# Patient Record
Sex: Female | Born: 1975 | Race: White | Hispanic: No | Marital: Married | Smoking: Never smoker
Health system: Southern US, Community
[De-identification: ages and names within clinical notes are randomized; demographics above are authoritative.]

## PROBLEM LIST (undated history)

## (undated) DIAGNOSIS — R519 Headache, unspecified: Secondary | ICD-10-CM

## (undated) DIAGNOSIS — I10 Essential (primary) hypertension: Secondary | ICD-10-CM

## (undated) DIAGNOSIS — R51 Headache: Secondary | ICD-10-CM

## (undated) DIAGNOSIS — R079 Chest pain, unspecified: Secondary | ICD-10-CM

## (undated) HISTORY — DX: Headache, unspecified: R51.9

## (undated) HISTORY — PX: TUBAL LIGATION: SHX77

## (undated) HISTORY — DX: Chest pain, unspecified: R07.9

## (undated) HISTORY — DX: Headache: R51

## (undated) HISTORY — DX: Essential (primary) hypertension: I10

---

## 2012-05-23 ENCOUNTER — Encounter: Payer: Self-pay | Admitting: Cardiology

## 2012-05-23 ENCOUNTER — Ambulatory Visit (INDEPENDENT_AMBULATORY_CARE_PROVIDER_SITE_OTHER): Payer: BC Managed Care – PPO | Admitting: Cardiology

## 2012-05-23 VITALS — BP 160/100 | HR 59 | Ht 62.0 in | Wt 256.8 lb

## 2012-05-23 DIAGNOSIS — R5381 Other malaise: Secondary | ICD-10-CM

## 2012-05-23 DIAGNOSIS — I10 Essential (primary) hypertension: Secondary | ICD-10-CM

## 2012-05-23 DIAGNOSIS — R0989 Other specified symptoms and signs involving the circulatory and respiratory systems: Secondary | ICD-10-CM

## 2012-05-23 DIAGNOSIS — R5383 Other fatigue: Secondary | ICD-10-CM

## 2012-05-23 DIAGNOSIS — R0683 Snoring: Secondary | ICD-10-CM

## 2012-05-23 DIAGNOSIS — R0602 Shortness of breath: Secondary | ICD-10-CM

## 2012-05-23 DIAGNOSIS — R072 Precordial pain: Secondary | ICD-10-CM

## 2012-05-23 MED ORDER — SPIRONOLACTONE 25 MG PO TABS: 25.0000 mg | ORAL_TABLET | Freq: Every day | ORAL | Status: DC

## 2012-05-23 NOTE — Patient Instructions (Addendum)
Please start Spironolactone 25 mg a day Continue all other medications as listed.  Please return in 1 week for lab work. (BNP, BMP, TSH and Cortisol level) You have also been asked to submit a 24 hour urine sample.  Your physician has recommended that you have a sleep study in [place]. This test records several body functions during sleep, including: brain activity, eye movement, oxygen and carbon dioxide blood levels, heart rate and rhythm, breathing rate and rhythm, the flow of air through your mouth and nose, snoring, body muscle movements, and chest and belly movement.  Follow up with Dr Antoine Poche in 1 month.

## 2012-05-23 NOTE — Progress Notes (Signed)
.   HPI The patient presents for evaluation of hypertension. This has been very difficult to control. She notices back in November when she had a headache. She had her blood pressure checked and she reports it was 225/130. She has had followed him he has been difficult to control since that time. Recently because of some chest discomfort she was sent for a stress perfusion study. However, in stage I of exercise she went up to 230/133. She brings me a blood pressure diary and her pressures are typically 170s systolic and above 95 diastolic despite medical management. She does get some back discomfort some mild chest discomfort but she does not describe classic substernal chest pressure, neck or arm discomfort. She is noticing shortness of breath with activities such as walking through the parking lot which is new over the past several months. She denies any resting shortness of breath, PND or orthopnea. She's not describing palpitations, presyncope or syncope. Of note she does snore, has frequent headaches and daytime somnolence.  Allergies  Allergen Reactions  . Amlodipine     Dizziness, hot flashes and swelling    Current Outpatient Prescriptions  Medication Sig Dispense Refill  . aspirin 325 MG EC tablet Take 325 mg by mouth 2 (two) times daily.       Marland Kitchen lisinopril-hydrochlorothiazide (PRINZIDE,ZESTORETIC) 20-12.5 MG per tablet Take 2 tablets by mouth daily.       Marland Kitchen METOPROLOL TARTRATE PO Take 100 mg by mouth 2 (two) times daily. Take two tabs bid      . potassium chloride (KLOR-CON) 20 MEQ packet Take 20 mEq by mouth daily.      Marland Kitchen spironolactone (ALDACTONE) 25 MG tablet Take 1 tablet (25 mg total) by mouth daily.  30 tablet  11   No current facility-administered medications for this visit.    Past Medical History  Diagnosis Date  . Chest pain   . New onset of headaches   . Hypertension     Past Surgical History  Procedure Laterality Date  . Tubal ligation      Family History    Problem Relation Age of Onset  . Arrhythmia Father   . CAD Paternal Grandfather 65  . Hypertension Father   . Hypertension Mother   . Cancer Mother     Uterine    History   Social History  . Marital Status: Married    Spouse Name: N/A    Number of Children: 4  . Years of Education: N/A   Occupational History  .     Social History Main Topics  . Smoking status: Never Smoker   . Smokeless tobacco: Not on file  . Alcohol Use: Not on file  . Drug Use: Not on file  . Sexually Active: Not on file   Other Topics Concern  . Not on file   Social History Narrative  . No narrative on file    ROS:  Positive for headaches, dyspnea, nausea. Otherwise as stated in the HPI and negative for all other systems.  PHYSICAL EXAM BP 160/100  Pulse 59  Ht 5\' 2"  (1.575 m)  Wt 256 lb 12.8 oz (116.484 kg)  BMI 46.96 kg/m2 GENERAL:  Well appearing HEENT:  Pupils equal round and reactive, fundi not visualized, oral mucosa unremarkable NECK:  No jugular venous distention, waveform within normal limits, carotid upstroke brisk and symmetric, no bruits, no thyromegaly LYMPHATICS:  No cervical, inguinal adenopathy LUNGS:  Clear to auscultation bilaterally BACK:  No CVA tenderness CHEST:  Unremarkable HEART:  PMI not displaced or sustained,S1 and S2 within normal limits, no S3, no S4, no clicks, no rubs, no murmurs ABD:  Flat, positive bowel sounds normal in frequency in pitch, no bruits, no rebound, no guarding, no midline pulsatile mass, no hepatomegaly, no splenomegaly, obese EXT:  2 plus pulses throughout, no edema, no cyanosis no clubbing SKIN:  No rashes no nodules NEURO:  Cranial nerves II through XII grossly intact, motor grossly intact throughout PSYCH:  Cognitively intact, oriented to person place and time  EKG:  Sinus rhythm, rate 59, axis within normal limits, intervals within normal limits, no acute ST-T wave changes.  05/23/2012   ASSESSMENT AND PLAN   HTN - I will look for  secondary causes and check a 24-hour urine for VMA and metanephrines and cortisol level. I will also check a TSH. I will add to her medications by adding spironolactone 25 mg daily. As her potassium has been low she should remain on the current potassium supplementation and I will check a basic metabolic profile in 10 days. I will also screen for sleep apnea.  Sleep apnea - She'll most definitely has this and she will be screened and treated appropriately.  Obesity - The patient understands the need to lose weight with diet and exercise. We have discussed specific strategies for this.  Chest pain - Her pain is somewhat atypical. No change in therapy is indicated. I will consider screening treadmill testing once her blood pressure is controlled.

## 2012-05-27 ENCOUNTER — Other Ambulatory Visit: Payer: Self-pay

## 2012-05-29 ENCOUNTER — Ambulatory Visit: Payer: Self-pay | Admitting: Cardiology

## 2012-05-30 ENCOUNTER — Other Ambulatory Visit (INDEPENDENT_AMBULATORY_CARE_PROVIDER_SITE_OTHER): Payer: BC Managed Care – PPO

## 2012-05-30 DIAGNOSIS — R5383 Other fatigue: Secondary | ICD-10-CM

## 2012-05-30 DIAGNOSIS — I10 Essential (primary) hypertension: Secondary | ICD-10-CM

## 2012-05-30 DIAGNOSIS — R0602 Shortness of breath: Secondary | ICD-10-CM

## 2012-05-30 DIAGNOSIS — R5381 Other malaise: Secondary | ICD-10-CM

## 2012-05-30 LAB — BASIC METABOLIC PANEL WITH GFR
BUN: 13 mg/dL (ref 6–23)
CO2: 30 meq/L (ref 19–32)
Calcium: 9.3 mg/dL (ref 8.4–10.5)
Chloride: 102 meq/L (ref 96–112)
Creatinine, Ser: 0.8 mg/dL (ref 0.4–1.2)
GFR: 88.48 mL/min (ref >60.00)
Glucose, Bld: 83 mg/dL (ref 70–99)
Potassium: 3.2 meq/L — ABNORMAL LOW (ref 3.5–5.1)
Sodium: 137 meq/L (ref 135–145)

## 2012-05-30 LAB — BRAIN NATRIURETIC PEPTIDE: Pro B Natriuretic peptide (BNP): 136 pg/mL — ABNORMAL HIGH (ref 0.0–100.0)

## 2012-05-30 LAB — TSH: TSH: 1.62 u[IU]/mL (ref 0.35–5.50)

## 2012-05-30 LAB — CORTISOL: Cortisol, Plasma: 8 ug/dL

## 2012-06-01 ENCOUNTER — Other Ambulatory Visit: Payer: Self-pay | Admitting: *Deleted

## 2012-06-01 ENCOUNTER — Encounter: Payer: Self-pay | Admitting: Cardiology

## 2012-06-01 DIAGNOSIS — E876 Hypokalemia: Secondary | ICD-10-CM

## 2012-06-05 ENCOUNTER — Encounter: Payer: Self-pay | Admitting: Cardiology

## 2012-06-05 ENCOUNTER — Telehealth: Payer: Self-pay | Admitting: Cardiology

## 2012-06-05 NOTE — Telephone Encounter (Signed)
Pt is having trouble with my chart and she was trying to look up her test results and she has a question regarding her results

## 2012-06-05 NOTE — Telephone Encounter (Signed)
Left message of results on voicemail and asked pt to call back with further questions

## 2012-06-07 ENCOUNTER — Encounter: Payer: Self-pay | Admitting: Cardiology

## 2012-06-07 ENCOUNTER — Other Ambulatory Visit: Payer: Self-pay | Admitting: *Deleted

## 2012-06-07 ENCOUNTER — Telehealth: Payer: Self-pay | Admitting: Internal Medicine

## 2012-06-07 MED ORDER — POTASSIUM CHLORIDE 20 MEQ PO PACK: 20.0000 meq | PACK | Freq: Every day | ORAL | Status: DC

## 2012-06-07 MED ORDER — POTASSIUM CHLORIDE CRYS ER 20 MEQ PO TBCR: 20.0000 meq | EXTENDED_RELEASE_TABLET | Freq: Every day | ORAL | Status: DC

## 2012-06-07 NOTE — Telephone Encounter (Signed)
Called in refill for Klor-Con. Initially ordered packets but verified with patient that she usually takes tablets. Called into pharmacy to ensure patient would receive in tablet form.

## 2012-06-07 NOTE — Telephone Encounter (Signed)
New Problem     Needs clarification of on KLOR-CON.

## 2012-06-07 NOTE — Telephone Encounter (Signed)
PC to Dominique at Pharmacy just re-verifying that the refill of Klor-Con should be in tablet form. Reviewed and agreed.

## 2012-06-13 ENCOUNTER — Other Ambulatory Visit (INDEPENDENT_AMBULATORY_CARE_PROVIDER_SITE_OTHER): Payer: BC Managed Care – PPO

## 2012-06-13 ENCOUNTER — Other Ambulatory Visit: Payer: BC Managed Care – PPO

## 2012-06-13 DIAGNOSIS — E876 Hypokalemia: Secondary | ICD-10-CM

## 2012-06-13 LAB — BASIC METABOLIC PANEL
Calcium: 9.3 mg/dL (ref 8.4–10.5)
GFR: 98.6 mL/min (ref >60.00)
Glucose, Bld: 82 mg/dL (ref 70–99)
Potassium: 3.9 mEq/L (ref 3.5–5.1)
Sodium: 141 mEq/L (ref 135–145)

## 2012-06-14 ENCOUNTER — Other Ambulatory Visit: Payer: BC Managed Care – PPO

## 2012-06-19 ENCOUNTER — Telehealth: Payer: Self-pay | Admitting: Cardiology

## 2012-06-19 NOTE — Telephone Encounter (Signed)
Pt informed of normal lab results

## 2012-06-19 NOTE — Telephone Encounter (Signed)
Message copied by Burnice Logan on Mon Jun 19, 2012 10:33 AM ------      Message from: Rollene Rotunda      Created: Sat Jun 17, 2012  1:38 PM       [facility] [place].  Call Ms. Silbernagel with the results and send results to Estanislado Pandy, MD       ------

## 2012-06-20 ENCOUNTER — Ambulatory Visit (INDEPENDENT_AMBULATORY_CARE_PROVIDER_SITE_OTHER): Payer: BC Managed Care – PPO | Admitting: Cardiology

## 2012-06-20 ENCOUNTER — Encounter: Payer: Self-pay | Admitting: Cardiology

## 2012-06-20 VITALS — BP 126/74 | HR 64 | Ht 61.0 in | Wt 256.8 lb

## 2012-06-20 DIAGNOSIS — I10 Essential (primary) hypertension: Secondary | ICD-10-CM

## 2012-06-20 DIAGNOSIS — R072 Precordial pain: Secondary | ICD-10-CM

## 2012-06-20 NOTE — Patient Instructions (Addendum)
The current medical regimen is effective;  continue present plan and medications.  Follow up in 4 months with Dr Hochrein.  You will receive a letter in the mail 2 months before you are due.  Please call us when you receive this letter to schedule your follow up appointment.  

## 2012-06-20 NOTE — Progress Notes (Signed)
   HPI The patient presents for evaluation of hypertension. This has been very difficult to control. At the last visit I checked VMA and it was normal. I sent her for sleep study which demonstrated moderate apnea she has followup of his CPAP scheduled. I started her on Spiriva one time. Her blood pressures at home are now down to the 140s over 80s or 90s. She's still having some headaches. Overall she feels well. She's not noticing any palpitations, presyncope or syncope. She has no new shortness of breath or chest discomfort. She had followup labs and her potassium was found to be within normal range.  Allergies  Allergen Reactions  . Amlodipine     Dizziness, hot flashes and swelling    Current Outpatient Prescriptions  Medication Sig Dispense Refill  . aspirin 325 MG EC tablet Take 325 mg by mouth 2 (two) times daily.       Marland Kitchen lisinopril-hydrochlorothiazide (PRINZIDE,ZESTORETIC) 20-12.5 MG per tablet Take 2 tablets by mouth daily.       Marland Kitchen METOPROLOL TARTRATE PO Take 100 mg by mouth 2 (two) times daily. Take two tabs bid      . potassium chloride SA (K-DUR,KLOR-CON) 20 MEQ tablet Take 1 tablet (20 mEq total) by mouth daily.  30 tablet  6  . spironolactone (ALDACTONE) 25 MG tablet Take 1 tablet (25 mg total) by mouth daily.  30 tablet  11   No current facility-administered medications for this visit.    Past Medical History  Diagnosis Date  . Chest pain   . New onset of headaches   . Hypertension     Past Surgical History  Procedure Laterality Date  . Tubal ligation      ROS:  Positive for headaches.  Otherwise as stated in the HPI and negative for all other systems.  PHYSICAL EXAM BP 126/74  Pulse 64  Ht 5\' 1"  (1.549 m)  Wt 256 lb 12.8 oz (116.484 kg)  BMI 48.55 kg/m2 GENERAL:  Well appearing NECK:  No jugular venous distention, waveform within normal limits, carotid upstroke brisk and symmetric, no bruits, no thyromegaly LUNGS:  Clear to auscultation bilaterally CHEST:   Unremarkable HEART:  PMI not displaced or sustained,S1 and S2 within normal limits, no S3, no S4, no clicks, no rubs, no murmurs ABD:  Flat, positive bowel sounds normal in frequency in pitch, no bruits, no rebound, no guarding, no midline pulsatile mass, no hepatomegaly, no splenomegaly, obese EXT:  2 plus pulses throughout, no edema, no cyanosis no clubbing  ASSESSMENT AND PLAN  HTN - Her blood pressure now seems to be well controlled.she will continue the meds as listed. Given the combination of ACE inhibitor, potassium and spironolactone I will suggest that she get a BP med per Dr. Neita Carp about 3 times per year. I've given her written instructions to get this checked next month.  Sleep apnea - She will continue with followup and treatment of this..  Obesity - The patient understands the need to lose weight with diet and exercise. We have discussed specific strategies for this.  Chest pain - She's no longer reporting chest discomfort. However, I will consider exercise treadmill testing in the future should she have this complaint.

## 2012-06-28 ENCOUNTER — Ambulatory Visit: Payer: Self-pay | Admitting: Cardiology

## 2012-08-28 ENCOUNTER — Other Ambulatory Visit: Payer: Self-pay | Admitting: *Deleted

## 2012-08-28 DIAGNOSIS — R0683 Snoring: Secondary | ICD-10-CM

## 2012-08-28 DIAGNOSIS — R5383 Other fatigue: Secondary | ICD-10-CM

## 2012-09-11 ENCOUNTER — Encounter: Payer: Self-pay | Admitting: Nurse Practitioner

## 2012-09-11 ENCOUNTER — Other Ambulatory Visit: Payer: Self-pay | Admitting: *Deleted

## 2012-09-11 ENCOUNTER — Ambulatory Visit (INDEPENDENT_AMBULATORY_CARE_PROVIDER_SITE_OTHER): Payer: BC Managed Care – PPO | Admitting: Nurse Practitioner

## 2012-09-11 VITALS — BP 150/100 | HR 80 | Ht 61.0 in | Wt 262.0 lb

## 2012-09-11 DIAGNOSIS — I10 Essential (primary) hypertension: Secondary | ICD-10-CM

## 2012-09-11 LAB — BASIC METABOLIC PANEL
BUN: 13 mg/dL (ref 6–23)
CO2: 26 mEq/L (ref 19–32)
Calcium: 9.1 mg/dL (ref 8.4–10.5)
Chloride: 103 mEq/L (ref 96–112)
Creatinine, Ser: 0.8 mg/dL (ref 0.4–1.2)
GFR: 89.67 mL/min (ref >60.00)
Glucose, Bld: 92 mg/dL (ref 70–99)
Potassium: 3.5 mEq/L (ref 3.5–5.1)
Sodium: 136 mEq/L (ref 135–145)

## 2012-09-11 NOTE — Patient Instructions (Addendum)
Continue to monitor your blood pressure at home  We are going to do a 24 hour ambulatory BP monitor  We are going to arrange for a CTangio of the abdomen/pelvis to look at your renal arteries  Dr. Antoine Poche will see you back for discussion  Here are my tips to lose weight:  1. Drink only water. You do not need milk, juice, tea, soda or diet soda.  2. Do not eat anything "white". This includes white bread, potatoes, rice or mayo  3. Stay away from fried foods and sweets  4. Your portion should be the size of the palm of your hand.  5. Know what your weaknesses are and avoid.  6. Find an exercise you like and do it every day for 45 to 60 minutes.   Call the [facility] office at [phone] if you have any questions, problems or concerns.

## 2012-09-11 NOTE — Progress Notes (Addendum)
Ruth Ruiz Date of Birth: [birth year] Medical Record #[id number]  History of Present Illness: Ruth Ruiz is seen back today for a work in visit. Seen for Dr. Antoine Poche. Has a history of HTN - reportedly difficult to control. Negative VMA in the past. Has morbid obesity. Last seen here in March.   Comes in today. She is here alone. Says her blood pressure continues to spike - especially with activities. BP can be as low as 117 to as high as 230. More swelling noted. Says she restricts her salt but has been using canned goods. Does not eat out. Remains short of breath with exertion. Weight is up 6 pounds.   Current Outpatient Prescriptions on File Prior to Visit  Medication Sig Dispense Refill  . aspirin 325 MG EC tablet Take 325 mg by mouth 2 (two) times daily.       Marland Kitchen lisinopril-hydrochlorothiazide (PRINZIDE,ZESTORETIC) 20-12.5 MG per tablet Take 2 tablets by mouth daily.       Marland Kitchen METOPROLOL TARTRATE PO Take 100 mg by mouth 2 (two) times daily. Take two tabs bid      . potassium chloride SA (K-DUR,KLOR-CON) 20 MEQ tablet Take 1 tablet (20 mEq total) by mouth daily.  30 tablet  6  . spironolactone (ALDACTONE) 25 MG tablet Take 1 tablet (25 mg total) by mouth daily.  30 tablet  11   No current facility-administered medications on file prior to visit.    Allergies  Allergen Reactions  . Amlodipine     Dizziness, hot flashes and swelling    Past Medical History  Diagnosis Date  . Chest pain   . New onset of headaches   . Hypertension     Past Surgical History  Procedure Laterality Date  . Tubal ligation      History  Smoking status  . Never Smoker   Smokeless tobacco  . Not on file    History  Alcohol Use No    Family History  Problem Relation Age of Onset  . Arrhythmia Father   . Hypertension Father   . CAD Paternal Grandfather 39  . Hypertension Mother   . Cancer Mother     Uterine    Review of Systems: The review of systems is per the HPI.  All other  systems were reviewed and are negative.  Physical Exam: BP 150/100  Pulse 80  Ht 5\' 1"  (1.549 m)  Wt 262 lb (118.842 kg)  BMI 49.53 kg/m2 Patient is very pleasant and in no acute distress. She is morbidly obese. Skin is warm and dry. Color is normal.  HEENT is unremarkable. Normocephalic/atraumatic. PERRL. Sclera are nonicteric. Neck is supple. No masses. No JVD. Lungs are clear. Cardiac exam shows a regular rate and rhythm. Abdomen is soft. Extremities are quite full but without significant edema in my opinion. Gait and ROM are intact. No gross neurologic deficits noted.  LABORATORY DATA: Pending  Lab Results  Component Value Date   GLUCOSE 82 06/13/2012   NA 141 06/13/2012   K 3.9 06/13/2012   CL 106 06/13/2012   CREATININE 0.7 06/13/2012   BUN 14 06/13/2012   CO2 28 06/13/2012   TSH 1.62 05/30/2012    Assessment / Plan: 1. Labile HTN - discussed with Dr. Antoine Poche. Will arrange for 24 hour ambulatory BP and check CTA of the abdomen/pelvis to look for renal artery problems. Check BMET  No change in her medicines at this time. Dr. Antoine Poche would like to see her back in follow  up.  2. Morbid obesity - we have discussed the need for weight loss. My recommendations given in her instructions.   Patient is agreeable to this plan and will call if any problems develop in the interim.   Rosalio Macadamia, RN, ANP-C [facility] [address] [place], [place]  [address]  Addendum: Her imaging that was originally ordered has led to MR of the abdomen which shows a benign adenoma. Her aldosterone levels that were subsequently ordered are normal. Unfortunately, the MR of the abdomen did not image this abnormality in the chest. I have talked with Dr. Antoine Poche - the 24 hour ambulatory monitor shows her BP readings are all too high. He would like to have her Aldactone increased to 50 mg a day, see Dr. Neita Carp regarding her adenoma, and I have shown her CT to Dr. Shirlee Latch who agrees with cardiac  MRI. She will need a follow up BMET with the increase in Aldactone.

## 2012-09-14 ENCOUNTER — Ambulatory Visit (INDEPENDENT_AMBULATORY_CARE_PROVIDER_SITE_OTHER)
Admission: RE | Admit: 2012-09-14 | Discharge: 2012-09-14 | Disposition: A | Discharge status: Home / Self Care | Payer: BC Managed Care – PPO | Source: Ambulatory Visit | Attending: Nurse Practitioner | Admitting: Nurse Practitioner

## 2012-09-14 ENCOUNTER — Encounter (INDEPENDENT_AMBULATORY_CARE_PROVIDER_SITE_OTHER): Payer: BC Managed Care – PPO

## 2012-09-14 ENCOUNTER — Encounter: Payer: Self-pay | Admitting: Nurse Practitioner

## 2012-09-14 DIAGNOSIS — I10 Essential (primary) hypertension: Secondary | ICD-10-CM

## 2012-09-14 MED ORDER — IOHEXOL 350 MG/ML SOLN
100.0000 mL | Freq: Once | INTRAVENOUS | Status: AC | PRN
  Administered 2012-09-14: 100 mL via INTRAVENOUS

## 2012-09-15 ENCOUNTER — Other Ambulatory Visit: Payer: Self-pay | Admitting: Nurse Practitioner

## 2012-09-15 ENCOUNTER — Other Ambulatory Visit: Payer: BC Managed Care – PPO | Admitting: *Deleted

## 2012-09-15 DIAGNOSIS — R918 Other nonspecific abnormal finding of lung field: Secondary | ICD-10-CM

## 2012-09-15 DIAGNOSIS — E278 Other specified disorders of adrenal gland: Secondary | ICD-10-CM

## 2012-09-15 DIAGNOSIS — I10 Essential (primary) hypertension: Secondary | ICD-10-CM

## 2012-09-18 LAB — ALDOSTERONE + RENIN ACTIVITY W/ RATIO

## 2012-09-22 ENCOUNTER — Ambulatory Visit
Admission: RE | Admit: 2012-09-22 | Discharge: 2012-09-22 | Disposition: A | Discharge status: Home / Self Care | Payer: BC Managed Care – PPO | Source: Ambulatory Visit | Attending: Nurse Practitioner | Admitting: Nurse Practitioner

## 2012-09-22 ENCOUNTER — Ambulatory Visit: Payer: BC Managed Care – PPO | Admitting: *Deleted

## 2012-09-22 ENCOUNTER — Telehealth: Payer: Self-pay | Admitting: *Deleted

## 2012-09-22 ENCOUNTER — Encounter: Payer: Self-pay | Admitting: Nurse Practitioner

## 2012-09-22 DIAGNOSIS — I10 Essential (primary) hypertension: Secondary | ICD-10-CM

## 2012-09-22 DIAGNOSIS — R918 Other nonspecific abnormal finding of lung field: Secondary | ICD-10-CM

## 2012-09-22 DIAGNOSIS — E278 Other specified disorders of adrenal gland: Secondary | ICD-10-CM

## 2012-09-22 MED ORDER — GADOBENATE DIMEGLUMINE 529 MG/ML IV SOLN
20.0000 mL | Freq: Once | INTRAVENOUS | Status: AC | PRN
  Administered 2012-09-22: 20 mL via INTRAVENOUS

## 2012-09-22 NOTE — Telephone Encounter (Signed)
S/w pt the results are not in for  lab I cancelled lab appt for today and will discuss with pt Monday when Lawson Fiscal gets back. I stated to keep other test today and will f/u with labv to see where results are

## 2012-09-23 LAB — ALDOSTERONE + RENIN ACTIVITY W/ RATIO
ALDO / PRA Ratio: 27.6 Ratio (ref 0.9–28.9)
Aldosterone: 8 ng/dL
PRA LC/MS/MS: 0.29 ng/mL/h (ref 0.25–5.82)

## 2012-09-25 ENCOUNTER — Telehealth: Payer: Self-pay | Admitting: Cardiology

## 2012-09-25 NOTE — Telephone Encounter (Signed)
Pt aware Dr Antoine Poche not in the office today and to my knowledge her results have not been reviewed.  Will forward Norma Fredrickson, NP

## 2012-09-25 NOTE — Telephone Encounter (Signed)
Avie Arenas called and stated pt had called Pam and wanted to know the results. S/w pt I stated pt will be hearing back with the results tomorrow with the MRA and lab results when Dr. Antoine Poche is in the office, Lawson Fiscal has to s/w Dr. Antoine Poche first. Pt verbally understood

## 2012-09-25 NOTE — Telephone Encounter (Signed)
New Problem  Pt is calling about the results of her MRI. She asked if you could give her a call back.

## 2012-09-26 ENCOUNTER — Telehealth: Payer: Self-pay | Admitting: Nurse Practitioner

## 2012-09-26 MED ORDER — SPIRONOLACTONE 50 MG PO TABS: 50.0000 mg | ORAL_TABLET | Freq: Every day | ORAL | Status: DC

## 2012-09-26 NOTE — Telephone Encounter (Signed)
Patient has been called her MR of the abdomen. This shows a benign adenoma. The mass abutting the right atrium was not imaged. I have talked with Dr. Antoine Poche. He has advised increasing the aldactone to 50 mg a day. Check BMET in one week. Her 24 hour BP monitor showed all high readings. Dr. Shirlee Latch has looked at her original CT and feels a cardiac MRI will define the mass in the hilar region abutting the right atrium. Patient has been called. Also told to follow up with Dr. Neita Carp as well. I have sent him my notes and the imaging studies.

## 2012-09-26 NOTE — Telephone Encounter (Signed)
Please see next phone note by Norma Fredrickson, NP

## 2012-09-26 NOTE — Telephone Encounter (Signed)
Follow-up:    Called in wanting to know if Dr. Antoine Poche had red the results of her test today.  Please call back.

## 2012-09-26 NOTE — Addendum Note (Signed)
Addended by: Rosalio Macadamia on: 09/26/2012 04:33 PM   Modules accepted: Orders, Medications

## 2012-09-27 ENCOUNTER — Other Ambulatory Visit: Payer: Self-pay | Admitting: Nurse Practitioner

## 2012-09-27 ENCOUNTER — Telehealth: Payer: Self-pay | Admitting: Nurse Practitioner

## 2012-09-27 DIAGNOSIS — I5189 Other ill-defined heart diseases: Secondary | ICD-10-CM

## 2012-09-27 NOTE — Telephone Encounter (Signed)
Patient's insurance will not cover the cardiac MRI - have to proceed with echo first - echo has been arranged for Thursday morning, June 19th. Patient has been notified by the [facility] department.   Rosalio Macadamia, RN, ANP-C [facility] [address] [place], [place]  [address]

## 2012-09-28 ENCOUNTER — Ambulatory Visit (HOSPITAL_COMMUNITY): Payer: BC Managed Care – PPO | Attending: Cardiology

## 2012-09-28 DIAGNOSIS — R079 Chest pain, unspecified: Secondary | ICD-10-CM | POA: Insufficient documentation

## 2012-09-28 DIAGNOSIS — R0609 Other forms of dyspnea: Secondary | ICD-10-CM | POA: Insufficient documentation

## 2012-09-28 DIAGNOSIS — R9430 Abnormal result of cardiovascular function study, unspecified: Secondary | ICD-10-CM | POA: Insufficient documentation

## 2012-09-28 DIAGNOSIS — R222 Localized swelling, mass and lump, trunk: Secondary | ICD-10-CM | POA: Insufficient documentation

## 2012-09-28 DIAGNOSIS — I5189 Other ill-defined heart diseases: Secondary | ICD-10-CM

## 2012-09-28 DIAGNOSIS — I10 Essential (primary) hypertension: Secondary | ICD-10-CM | POA: Insufficient documentation

## 2012-09-28 DIAGNOSIS — R0989 Other specified symptoms and signs involving the circulatory and respiratory systems: Secondary | ICD-10-CM | POA: Insufficient documentation

## 2012-09-28 NOTE — Progress Notes (Signed)
Echocardiogram performed.  

## 2012-10-02 ENCOUNTER — Telehealth: Payer: Self-pay | Admitting: Cardiology

## 2012-10-02 NOTE — Telephone Encounter (Signed)
BP last night was 109/69 wanted to know if she should take her BP med this AM.  Pt states she did take her medication already and is aware as low as she is not having any dizziness or syncope it is fine to take medication.

## 2012-10-02 NOTE — Telephone Encounter (Signed)
New problem   Pt wants to know if she needs to take her b/p medicine this morning

## 2012-10-03 ENCOUNTER — Other Ambulatory Visit (INDEPENDENT_AMBULATORY_CARE_PROVIDER_SITE_OTHER): Payer: BC Managed Care – PPO

## 2012-10-03 DIAGNOSIS — I10 Essential (primary) hypertension: Secondary | ICD-10-CM

## 2012-10-04 LAB — BASIC METABOLIC PANEL
BUN: 16 mg/dL (ref 6–23)
CO2: 30 mEq/L (ref 19–32)
Calcium: 9.5 mg/dL (ref 8.4–10.5)
Chloride: 103 mEq/L (ref 96–112)
Creatinine, Ser: 0.9 mg/dL (ref 0.4–1.2)
GFR: 79.97 mL/min (ref >60.00)
Glucose, Bld: 108 mg/dL — ABNORMAL HIGH (ref 70–99)
Potassium: 3.9 mEq/L (ref 3.5–5.1)
Sodium: 138 mEq/L (ref 135–145)

## 2012-10-06 ENCOUNTER — Telehealth: Payer: Self-pay | Admitting: *Deleted

## 2012-10-06 ENCOUNTER — Other Ambulatory Visit: Payer: Self-pay | Admitting: Nurse Practitioner

## 2012-10-06 DIAGNOSIS — R222 Localized swelling, mass and lump, trunk: Secondary | ICD-10-CM

## 2012-10-06 DIAGNOSIS — R935 Abnormal findings on diagnostic imaging of other abdominal regions, including retroperitoneum: Secondary | ICD-10-CM

## 2012-10-06 NOTE — Telephone Encounter (Signed)
S/w pt to let pt know appeal went through for MRI and our office will be calling shortly to schedule pts appt

## 2012-10-09 ENCOUNTER — Encounter: Payer: Self-pay | Admitting: Nurse Practitioner

## 2012-10-10 ENCOUNTER — Ambulatory Visit (HOSPITAL_COMMUNITY)
Admission: RE | Admit: 2012-10-10 | Discharge: 2012-10-10 | Disposition: A | Discharge status: Home / Self Care | Payer: BC Managed Care – PPO | Source: Ambulatory Visit | Attending: Nurse Practitioner | Admitting: Nurse Practitioner

## 2012-10-10 DIAGNOSIS — R222 Localized swelling, mass and lump, trunk: Secondary | ICD-10-CM

## 2012-10-10 DIAGNOSIS — M8560 Other cyst of bone, unspecified site: Secondary | ICD-10-CM | POA: Insufficient documentation

## 2012-10-10 DIAGNOSIS — R935 Abnormal findings on diagnostic imaging of other abdominal regions, including retroperitoneum: Secondary | ICD-10-CM

## 2012-10-10 MED ORDER — GADOBENATE DIMEGLUMINE 529 MG/ML IV SOLN
35.0000 mL | Freq: Once | INTRAVENOUS | Status: AC
  Administered 2012-10-10: 35 mL via INTRAVENOUS

## 2012-10-12 ENCOUNTER — Telehealth: Payer: Self-pay | Admitting: Cardiology

## 2012-10-12 NOTE — Telephone Encounter (Signed)
[facility] spoke with pt

## 2012-10-12 NOTE — Telephone Encounter (Signed)
Will forward to Dr Hochrein  

## 2012-10-12 NOTE — Telephone Encounter (Signed)
Follow Up ° ° ° ° °Pt calling following up on test results. °

## 2012-10-24 ENCOUNTER — Ambulatory Visit: Payer: BC Managed Care – PPO | Admitting: Nurse Practitioner

## 2012-11-24 ENCOUNTER — Ambulatory Visit (INDEPENDENT_AMBULATORY_CARE_PROVIDER_SITE_OTHER): Payer: BC Managed Care – PPO | Admitting: Nurse Practitioner

## 2012-11-24 ENCOUNTER — Other Ambulatory Visit: Payer: Self-pay | Admitting: *Deleted

## 2012-11-24 ENCOUNTER — Encounter: Payer: Self-pay | Admitting: Nurse Practitioner

## 2012-11-24 VITALS — BP 150/90 | HR 88 | Ht 61.0 in | Wt 266.8 lb

## 2012-11-24 DIAGNOSIS — E876 Hypokalemia: Secondary | ICD-10-CM

## 2012-11-24 DIAGNOSIS — I10 Essential (primary) hypertension: Secondary | ICD-10-CM

## 2012-11-24 LAB — BASIC METABOLIC PANEL
BUN: 11 mg/dL (ref 6–23)
CO2: 26 mEq/L (ref 19–32)
Calcium: 9.2 mg/dL (ref 8.4–10.5)
Chloride: 100 mEq/L (ref 96–112)
Creatinine, Ser: 0.7 mg/dL (ref 0.4–1.2)
GFR: 96.78 mL/min (ref >60.00)
Glucose, Bld: 113 mg/dL — ABNORMAL HIGH (ref 70–99)
Potassium: 3.4 mEq/L — ABNORMAL LOW (ref 3.5–5.1)
Sodium: 134 mEq/L — ABNORMAL LOW (ref 135–145)

## 2012-11-24 NOTE — Progress Notes (Signed)
Ruth Ruiz Date of Birth: [birth year] Medical Record #[id number]  History of Present Illness: Ruth Ruiz is seen back today for a follow up visit. Seen for Dr. Antoine Poche. She has HTN - difficult to control - negative VMA in the past. Also with morbid obesity.   I saw her back over 2 months ago. We referred her for CTA of the renals to rule out fibromuscular dysplasia - this turned out ok but she was noted to have a benign adrenal adenoma and also with a possible mass abutting the right atrium that turned out to be ok. Her echo looks ok as well. She had some trouble tolerating the higher dose of aldactone and this was cut back by Dr. Antoine Poche. Did have aldosterone levels done but this was on aldactone.   Comes back today. Here alone. Doing ok. Has a multitude of complaints - she has a pain on her left side - there about 90% of the time - comes and goes - not sure what makes it come and go. Still with swelling in hands and feet. Weight is up. Says she has cut back her salt - no more canned foods. Feels "fainty" with walking any distance. Arms tingling all over - especially with playing the piano. She has not been back to see Dr. Neita Carp - she did call him - was told that we needed to handle her BP. She is interested in referral to endocrine for her adenoma. Remains on low dose potassium replacement despite the aldactone. She is using her CPAP for her sleep apnea. BP at home is 140 to 150/mid 80's to 90's.   Current Outpatient Prescriptions  Medication Sig Dispense Refill  . aspirin 325 MG EC tablet Take 325 mg by mouth 2 (two) times daily.       . Cranberry 1000 MG CAPS Take by mouth daily.      Marland Kitchen lisinopril-hydrochlorothiazide (PRINZIDE,ZESTORETIC) 20-12.5 MG per tablet Take 2 tablets by mouth daily.       Marland Kitchen METOPROLOL TARTRATE PO Take 100 mg by mouth 2 (two) times daily. Take two tabs bid      . potassium chloride SA (K-DUR,KLOR-CON) 20 MEQ tablet Take 1 tablet (20 mEq total) by mouth daily.  30  tablet  6  . spironolactone (ALDACTONE) 50 MG tablet Take 25 mg by mouth daily.       No current facility-administered medications for this visit.    Allergies  Allergen Reactions  . Amlodipine     Dizziness, hot flashes and swelling    Past Medical History  Diagnosis Date  . Chest pain   . New onset of headaches   . Hypertension     Past Surgical History  Procedure Laterality Date  . Tubal ligation      History  Smoking status  . Never Smoker   Smokeless tobacco  . Not on file    History  Alcohol Use No    Family History  Problem Relation Age of Onset  . Arrhythmia Father   . Hypertension Father   . CAD Paternal Grandfather 88  . Hypertension Mother   . Cancer Mother     Uterine    Review of Systems: The review of systems is per the HPI.  All other systems were reviewed and are negative.  Physical Exam: BP 150/90  Pulse 88  Ht 5\' 1"  (1.549 m)  Wt 266 lb 12.8 oz (121.02 kg)  BMI 50.44 kg/m2 Patient is very pleasant and in no acute distress.  Remains obese. Weight is up 4 more pounds. Skin is warm and dry. Color is normal.  HEENT is unremarkable. Normocephalic/atraumatic. PERRL. Sclera are nonicteric. Neck is supple. No masses. No JVD. Lungs are clear. Cardiac exam shows a regular rate and rhythm. Abdomen is soft. Extremities are with trace edema. Gait and ROM are intact. No gross neurologic deficits noted.  LABORATORY DATA: BMET is pending  Lab Results  Component Value Date   GLUCOSE 108* 10/03/2012   NA 138 10/03/2012   K 3.9 10/03/2012   CL 103 10/03/2012   CREATININE 0.9 10/03/2012   BUN 16 10/03/2012   CO2 30 10/03/2012   TSH 1.62 05/30/2012    Echo Study Conclusions  - Left ventricle: The cavity size was normal. Wall thickness was increased in a pattern of mild LVH. Systolic function was normal. The estimated ejection fraction was in the range of 60% to 65%. Wall motion was normal; there were no regional wall motion abnormalities. Features  are consistent with a pseudonormal left ventricular filling pattern, with concomitant abnormal relaxation and increased filling pressure (grade 2 diastolic dysfunction). - Atrial septum: No defect or patent foramen ovale was identified.  CARDIAC MRI IMPRESSION: 1. 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 This is a benign finding. 2. Normal resting left ventricular size and function with calculated ejection fraction of 72%. 3. No delayed myocardial hyperenhancement to suggest scarring from prior myocardial infarction or infiltrative process.   Original Report Authenticated By: Trudie Reed, M.D.  CTA ABD/PELVIS IMPRESSION:  1. Normal appearance of the renal arteries, no evidence of renal artery stenosis or fibromuscular dysplasia.  2. Nearly 3 cm nonspecific right adrenal nodule. Statistically, this is highly likely an adrenal adenoma although other benign and malignant neoplasms can not be completely excluded. If in fact an adenoma, consider the possibility of an aldosterone secreting adenoma (Conn's syndrome) in the setting of poorly controlled hypertension.  3. On the superior-most images of this exam, there is an incompletely imaged 2.2 x 1.7 cm soft tissue density mass in the right infrahilar region abutting the right atrium. Differential considerations include a complex pericardial cyst, bronchogenic or esophageal duplication cyst, and potentially incompletely imaged adenopathy.  Recommend further evaluation of both the right adrenal nodule and the right infrahilar mass with an MRI of the abdomen with and without contrast. The coverage of the abdominal MRI should also include the right infrahilar lesion. Please place a note in the comments section of the order to evaluate the right infrahilar mass as well as the right adrenal lesion.  These results and recommendations were called by telephone  on 09/14/2012 at 10:45 a.m. to Dr. Norma Fredrickson, who verbally acknowledged these results.  Signed,  Sterling Big, MD Vascular & Interventional Radiologist [facility]    Assessment / Plan:  1. HTN - I have talked with Dr. Antoine Poche about Rene Kocher today - we both do not feel that she will tolerate the addition of Norvasc due to her swelling. Not really felt like she would tolerate Hydralazine as well. She had her aldosterone levels checked but this was when she was on aldactone. We are going to refer her to endocrine. For now, no change in her medicines. Continue with efforts at salt restriction, weight loss and wearing CPAP.   2. Morbid obesity -   3. Benign adrenal adenoma - to be followed by Dr. Neita Carp - we will send to endocrine.   Patient is agreeable  to this plan and will call if any problems develop in the interim.   Rosalio Macadamia, RN, ANP-C [facility] [address] [place], [place]  [address]

## 2012-11-24 NOTE — Patient Instructions (Addendum)
Continue with your current medicines   Avoid salt  We will send you to see the endocrinologist - Dr. Drinda Butts  We need to recheck your BMET today  See Dr. Antoine Poche in 3 months  Call the [facility] office at [phone] if you have any questions, problems or concerns.

## 2012-11-30 ENCOUNTER — Ambulatory Visit: Payer: BC Managed Care – PPO | Admitting: Endocrinology

## 2012-12-05 ENCOUNTER — Ambulatory Visit: Payer: BC Managed Care – PPO

## 2012-12-06 ENCOUNTER — Ambulatory Visit (INDEPENDENT_AMBULATORY_CARE_PROVIDER_SITE_OTHER): Payer: BC Managed Care – PPO | Admitting: Endocrinology

## 2012-12-06 ENCOUNTER — Encounter: Payer: Self-pay | Admitting: Endocrinology

## 2012-12-06 VITALS — BP 126/90 | HR 71 | Temp 98.1°F | Resp 12 | Ht 61.0 in | Wt 267.6 lb

## 2012-12-06 DIAGNOSIS — D3502 Benign neoplasm of left adrenal gland: Secondary | ICD-10-CM

## 2012-12-06 DIAGNOSIS — D3500 Benign neoplasm of unspecified adrenal gland: Secondary | ICD-10-CM

## 2012-12-06 DIAGNOSIS — I10 Essential (primary) hypertension: Secondary | ICD-10-CM

## 2012-12-06 MED ORDER — DOXAZOSIN MESYLATE 4 MG PO TABS: 4.0000 mg | ORAL_TABLET | Freq: Every day | ORAL | Status: DC

## 2012-12-06 MED ORDER — LABETALOL HCL 200 MG PO TABS: 200.0000 mg | ORAL_TABLET | Freq: Two times a day (BID) | ORAL | Status: DC

## 2012-12-06 NOTE — Progress Notes (Signed)
Patient ID: Ruth Ruiz, female   DOB: 20-Jul-1975, 36 y.o.   MRN: [id number]  REASON for consultation: Severe hypertension  History of Present Illness:  She was first found to have hypertension in November 2013 incidentally. She thinks her blood pressure was about 200/125 Since then has had several medications tried for her hypertension and has had inadequate control She is generally trying to watch her diet especially recently with no added salt in her foods and avoiding frozen or canned foods with high sodium. Does not eat fast food  Medication history: She was initially treated with lisinopril HCT which was increased to the highest dose and has been taking 2 tablets daily of the 20/12.5 mg However her blood pressure was relatively high with this alone and she was given metoprolol also in 12/13  She thinks her blood pressure has been only under fair control with most readings around 150/100. She is using a home blood pressure monitor with a large cuff which she thinks has been comparing well but the physician's office  She has had problems with low potassium levels periodically with the lowest level  being 3.2 in 05/2012. More recently has been given Aldactone 50 mg daily also with stabilization of potassium, however this month her potassium was 3.4 and she was given potassium supplement.  LAB studies: Her renal function has been normal, her aldosterone/creatinine ratio done in the afternoon was 28 but this was done without changing above medications Her urine VMA was normal. Also had a normal renal artery study with CT She has a left adrenal adenoma discovered when she was having the CT scan  Past Medical History  Diagnosis Date  . Chest pain   . New onset of headaches   . Hypertension     Past Surgical History  Procedure Laterality Date  . Tubal ligation      Family History  Problem Relation Age of Onset  . Arrhythmia Father   . Hypertension Father   . CAD Paternal  Grandfather 33  . Hypertension Mother   . Cancer Mother     Uterine    Social History:  reports that she has never smoked. She does not have any smokeless tobacco history on file. She reports that she does not drink alcohol or use illicit drugs.  Allergies:  Allergies  Allergen Reactions  . Amlodipine     Dizziness, hot flashes and swelling      Medication List       This list is accurate as of: 12/06/12  8:11 AM.  Always use your most recent med list.               amoxicillin 500 MG capsule  Commonly known as:  AMOXIL  Take 500 mg by mouth 3 (three) times daily.     aspirin 325 MG EC tablet  Take 325 mg by mouth 2 (two) times daily.     Cranberry 1000 MG Caps  Take by mouth daily.     ibuprofen 800 MG tablet  Commonly known as:  ADVIL,MOTRIN  Take 800 mg by mouth every 8 (eight) hours as needed for pain.     lisinopril-hydrochlorothiazide 20-12.5 MG per tablet  Commonly known as:  PRINZIDE,ZESTORETIC  Take 2 tablets by mouth daily.     METOPROLOL TARTRATE PO  Take 100 mg by mouth 2 (two) times daily. Take two tabs bid     potassium chloride SA 20 MEQ tablet  Commonly known as:  K-DUR,KLOR-CON  Take 1 tablet (  20 mEq total) by mouth daily.     spironolactone 50 MG tablet  Commonly known as:  ALDACTONE  Take 25 mg by mouth daily.         Relevant Results        11/24/12 10/03/12 09/11/12 06/13/12 05/30/12   Chem Profile   BUN  11 16 13 14 13    CHLORIDE  100 103 103 106 102   CO2  26 30 26 28 30    CREATININE  0.7 0.9 0.8 0.7 0.8   POTASSIUM  3.4 3.9 3.5 3.9 3.2   SODIUM  134 138 136 141 137   REVIEW OF SYSTEMS:            She has had difficulty with weight loss and has gained 20-25 pounds in the last year     Headaches: Have been present regularly and. Are worse later in day      Skin: No rash or infections; no flushing but her face will get red with increased physical activity   She has had only minimal problems with hirsuitism     Thyroid:  No  cold or heat intolerance, unusual fatigue.     No history of chest pain. Only rarely will feel palpitations       No swelling of feet.     No shortness of breath on exertion.     Bowel habits:   normal         She has had some joint  Pains.  she has been taking Motrin for the last week         Her menstrual cycles have been normal  PHYSICAL EXAM:  BP 126/72  Pulse 71  Temp(Src) 98.1 F (36.7 C)  Resp 12  Ht 5\' 1"  (1.549 m)  Wt 267 lb 9.6 oz (121.383 kg)  BMI 50.59 kg/m2  SpO2 96%  LMP 11/15/2012  GENERAL: She has marked generalized obesity. Does not have any cushingoid facies, plethora.   No pallor, clubbing of nails,  or edema.    Skin:  no rash or pigmentation. No thinning of forearm skin, no acne or hirsutism, no hair loss  EYES:  Externally normal.  Fundii:  normal discs and vessels.  ENT: Oral mucosa and tongue normal.  NECK exam: No lymphadenopathy. No supra-clavicular fat pads or buffalo hump  THYROID:  Not palpable.  HEART:  Normal apex, S1 and S2; no murmur or click.  CHEST:  Normal shape  Lungs:   Vescicular breath sounds heard equally.  No crepitations/ wheeze.  ABDOMEN:  No distention. No renal artery bruit heard Liver and spleen not palpable.  No other mass or tenderness.  NEUROLOGICAL:     .Reflexes are  normal at ankles.  SPINE AND JOINTS:  Normal.   ASSESSMENT:   Severe hypertension in a young patient currently with fair control on regimen of maximum dose lisinopril, HCTZ and 50 mg Aldactone  Adrenal adenoma with history of hypokalemia on HCTZ. No history of spontaneous hypokalemia  Aldosterone: Renin ratio of <30 although this was done in the afternoon hours and while she was taking lisinopril and Aldactone Also aldosterone level was quite normal at 8.0  Clinically had no evidence of Cushing's disease. Already has had normal cortisol and urine VMA tests which are normal No clinical features of pheochromocytoma     PLAN:   Will need  to assess her for hyperaldosteronism after stopping lisinopril, HCTZ and Aldactone Will switch her to labetalol and doxazosin and adjust the  dose as needed She will followup in 3 weeks  We will screen her for excessive potassium loss in a 24-hour urine after stopping the Aldactone for a week. If her urine potassium is <30 will not do any further workup  Otherwise would consider a salt loading test for hyperaldosteronism   Liora Myles 12/06/2012, 8:11 AM

## 2012-12-06 NOTE — Patient Instructions (Signed)
Stop lisinopril HCTZ and Aldactone  Reduce metoprolol to half tablet twice a day for 3 days and then stop  Labetalol 200 mg twice a day  Doxazosin 4 mg,  at bedtime daily

## 2012-12-12 ENCOUNTER — Telehealth: Payer: Self-pay | Admitting: Endocrinology

## 2012-12-12 MED ORDER — FUROSEMIDE 20 MG PO TABS: 20.0000 mg | ORAL_TABLET | Freq: Every day | ORAL | Status: DC

## 2012-12-12 MED ORDER — METOPROLOL TARTRATE 50 MG PO TABS: ORAL_TABLET | ORAL | Status: DC

## 2012-12-12 NOTE — Telephone Encounter (Signed)
Pt says she is having horrible headaches/ numbness and tingling.  Face is swelling around her eyes, her neck and ankles and a small rash on top of her feet. She says everything makes her exhausted.  She just started taking the doxazosin and labetolol and wants to know if this could cause her symptoms?  Please advise

## 2012-12-12 NOTE — Telephone Encounter (Signed)
What is her blood pressure, is she having swelling on her ankles?

## 2012-12-12 NOTE — Telephone Encounter (Signed)
Left message to return call 

## 2012-12-12 NOTE — Telephone Encounter (Signed)
rxs sent

## 2012-12-12 NOTE — Telephone Encounter (Signed)
Her main complaint is swelling, some numb feeling in the back of her head, some shakiness, mild headaches She will reduce both doxazosin and labetalol to half twice a day and start 50 mg twice a day of metoprolol Also take Lasix 20 mg daily as needed for swelling along with potassium 20 mg daily

## 2012-12-15 ENCOUNTER — Other Ambulatory Visit: Payer: BC Managed Care – PPO

## 2012-12-15 DIAGNOSIS — D3502 Benign neoplasm of left adrenal gland: Secondary | ICD-10-CM

## 2012-12-16 LAB — POTASSIUM, URINE, 24 HOUR: Potassium Urine: 17 mEq/L

## 2012-12-19 ENCOUNTER — Telehealth: Payer: Self-pay | Admitting: Endocrinology

## 2012-12-19 DIAGNOSIS — I10 Essential (primary) hypertension: Secondary | ICD-10-CM

## 2012-12-19 NOTE — Telephone Encounter (Signed)
She will need to be scheduled for a salt loading test at the short stay center at either hospital, instructions to be faxed

## 2012-12-22 ENCOUNTER — Other Ambulatory Visit: Payer: BC Managed Care – PPO

## 2012-12-22 ENCOUNTER — Other Ambulatory Visit: Payer: Self-pay | Admitting: Endocrinology

## 2012-12-22 ENCOUNTER — Other Ambulatory Visit: Payer: Self-pay | Admitting: *Deleted

## 2012-12-22 DIAGNOSIS — I10 Essential (primary) hypertension: Secondary | ICD-10-CM

## 2012-12-22 NOTE — Telephone Encounter (Signed)
Pt was given number to short stay to schedule her procedure.

## 2012-12-25 ENCOUNTER — Telehealth: Payer: Self-pay | Admitting: Endocrinology

## 2012-12-25 NOTE — Telephone Encounter (Signed)
Pt is aware, she is scheduled for testing this Thursday the 18th.

## 2012-12-25 NOTE — Telephone Encounter (Signed)
Pt says her BP has been running very high, she said it's been in the 150's, yesterday it was 207/109. She says you told her to take a half tablet of Labetolol and she took a whole tablet, she also took half a doxazosin.  She says her bp this morning was 175/109.  She took it while I was on the phone with her and it was 188/85 CB # [phone]

## 2012-12-25 NOTE — Telephone Encounter (Signed)
Continue to take full tablet of labetalol twice a day and increase doxazosin to half a tablet twice a day.  Will take 1-2 days to bring it down Please make sure she is scheduled for her short stay testing this week

## 2012-12-27 ENCOUNTER — Other Ambulatory Visit (HOSPITAL_COMMUNITY): Payer: Self-pay | Admitting: *Deleted

## 2012-12-28 ENCOUNTER — Encounter (HOSPITAL_COMMUNITY)
Admission: RE | Admit: 2012-12-28 | Discharge: 2012-12-28 | Disposition: A | Discharge status: Home / Self Care | Payer: BC Managed Care – PPO | Source: Ambulatory Visit | Attending: Endocrinology | Admitting: Endocrinology

## 2012-12-28 DIAGNOSIS — D3500 Benign neoplasm of unspecified adrenal gland: Secondary | ICD-10-CM | POA: Insufficient documentation

## 2012-12-28 MED ORDER — SODIUM CHLORIDE 0.9 % IV SOLN
Freq: Once | INTRAVENOUS | Status: AC
  Administered 2012-12-28: 10:00:00 via INTRAVENOUS
  Administered 2012-12-28: 1000 mL via INTRAVENOUS

## 2013-01-01 ENCOUNTER — Ambulatory Visit (INDEPENDENT_AMBULATORY_CARE_PROVIDER_SITE_OTHER): Payer: BC Managed Care – PPO | Admitting: Endocrinology

## 2013-01-01 ENCOUNTER — Encounter: Payer: Self-pay | Admitting: Endocrinology

## 2013-01-01 VITALS — BP 140/98 | HR 64 | Temp 98.4°F | Ht 61.0 in | Wt 271.7 lb

## 2013-01-01 DIAGNOSIS — E876 Hypokalemia: Secondary | ICD-10-CM

## 2013-01-01 DIAGNOSIS — I10 Essential (primary) hypertension: Secondary | ICD-10-CM

## 2013-01-01 NOTE — Progress Notes (Signed)
Patient ID: Ruth Ruiz, female   DOB: 27-Jun-1975, 37 y.o.   MRN: [id number]  REASON for visit: Uncontrolled hypertension  History of Present Illness:  She was first found to have hypertension in November 2013 incidentally. She thinks her blood pressure was about 200/125 She is generally trying to watch her diet especially recently with no added salt in her foods and avoiding frozen or canned foods with high sodium. Does not eat fast food She has had problems with low potassium levels periodically with the lowest level  being 3.2 in 05/2012. More recently has been given Aldactone 50 mg daily also with stabilization of potassium, however in August her potassium was 3.4 and she was given potassium supplement.  Medication history: She was initially treated with lisinopril HCT which was increased to the highest dose and has been taking 2 tablets daily of the 20/12.5 mg. She was given metoprolol also in 12/13 In preparation for her aldosterone studies she was switched from lisinopril HCTZ to doxazosin and labetalol and low dose metoprolol continued However she thinks her blood pressure is still high with systolic readings of 158-180 and diastolics of about 91-105 She has a headache fairly consistently  LAB studies: Her renal function has been normal, her aldosterone/creatinine ratio done in the afternoon was 28 but this was done without changing above medications Her urine VMA was normal. Also had a normal renal artery study with CT She has a left adrenal adenoma discovered when she was having the CT scan Last week after saline infusion her aldosterone level was <1.0  Past Medical History  Diagnosis Date  . Chest pain   . New onset of headaches   . Hypertension     Past Surgical History  Procedure Laterality Date  . Tubal ligation      Family History  Problem Relation Age of Onset  . Arrhythmia Father   . Hypertension Father   . CAD Paternal Grandfather 32  . Hypertension Mother   .  Cancer Mother     Uterine  . Diabetes Neg Hx     Social History:  reports that she has never smoked. She does not have any smokeless tobacco history on file. She reports that she does not drink alcohol or use illicit drugs.  Allergies:  Allergies  Allergen Reactions  . Amlodipine     Dizziness, hot flashes and swelling      Medication List       This list is accurate as of: 01/01/13 11:59 PM.  Always use your most recent med list.               amoxicillin 500 MG capsule  Commonly known as:  AMOXIL  Take 500 mg by mouth 3 (three) times daily.     aspirin 325 MG EC tablet  Take 325 mg by mouth 2 (two) times daily.     doxazosin 4 MG tablet  Commonly known as:  CARDURA  Take 2 mg by mouth 2 (two) times daily.     labetalol 200 MG tablet  Commonly known as:  NORMODYNE  Take 1 tablet (200 mg total) by mouth 2 (two) times daily.     metoprolol 50 MG tablet  Commonly known as:  LOPRESSOR  50 mg at bedtime. Take 1 tablet twice a day     spironolactone 50 MG tablet  Commonly known as:  ALDACTONE  Take 25 mg by mouth daily.         Relevant Results  11/24/12 10/03/12 09/11/12 06/13/12 05/30/12   Chem Profile   BUN  11 16 13 14 13    CHLORIDE  100 103 103 106 102   CO2  26 30 26 28 30    CREATININE  0.7 0.9 0.8 0.7 0.8   POTASSIUM  3.4 3.9 3.5 3.9 3.2   SODIUM  134 138 136 141 137     REVIEW OF SYSTEMS:            She has had difficulty with weight loss and has gained 20-25 pounds in the last year    Asking about left lateral chest wall pain near the lower part   PHYSICAL EXAM:  BP 140/98  Pulse 64  Temp(Src) 98.4 F (36.9 C) (Oral)  Ht 5\' 1"  (1.549 m)  Wt 271 lb 11.2 oz (123.242 kg)  BMI 51.36 kg/m2  SpO2 96%  LMP 11/15/2012  GENERAL: She has marked generalized obesity. Does not have any cushingoid facies, plethora.    ASSESSMENT:   Marked hypertension in a young patient previously with fair control on regimen of maximum dose lisinopril,  HCTZ and 50 mg Aldactone  Adrenal adenoma with history of hypokalemia on HCTZ. No history of spontaneous hypokalemia  Aldosterone: Renin ratio of <30, aldosterone level  normal at 8.0 and also now suppressed aldosterone after saline infusion of <1. This rules out hyperaldosteronism  Clinically has no evidence of Cushing's disease. Already has had normal cortisol and urine VMA tests which are normal No clinical features of pheochromocytoma and has nonspecific headaches    PLAN:   She can go back to taking Aldactone which had helped her blood pressure and prevent hypokalemia She will increase her doxazosin to 4 mg twice a day Followup  in one month Followup with PCP for other nonspecific symptoms like fatigue and left rib cage discomfort   Ruth Ruiz 01/03/2013, 8:33 AM

## 2013-01-01 NOTE — Patient Instructions (Addendum)
Doxazosin 1 twice daily  Lasix in am every 2 days  Restart potassium

## 2013-01-02 LAB — RENIN: Renin Activity: 0.05 ng/mL/h — ABNORMAL LOW (ref 0.25–5.82)

## 2013-01-02 LAB — ALDOSTERONE
Aldosterone, Serum: <1 ng/dL
Aldosterone, Serum: 1 ng/dL

## 2013-01-24 ENCOUNTER — Other Ambulatory Visit: Payer: BC Managed Care – PPO

## 2013-01-24 DIAGNOSIS — I10 Essential (primary) hypertension: Secondary | ICD-10-CM

## 2013-01-25 ENCOUNTER — Other Ambulatory Visit: Payer: BC Managed Care – PPO

## 2013-01-29 ENCOUNTER — Other Ambulatory Visit: Payer: Self-pay | Admitting: *Deleted

## 2013-01-29 ENCOUNTER — Encounter: Payer: Self-pay | Admitting: Endocrinology

## 2013-01-29 ENCOUNTER — Ambulatory Visit (INDEPENDENT_AMBULATORY_CARE_PROVIDER_SITE_OTHER): Payer: BC Managed Care – PPO | Admitting: Endocrinology

## 2013-01-29 VITALS — BP 118/84 | HR 68 | Temp 98.2°F | Resp 12 | Ht 61.0 in | Wt 262.8 lb

## 2013-01-29 DIAGNOSIS — I10 Essential (primary) hypertension: Secondary | ICD-10-CM

## 2013-01-29 LAB — BASIC METABOLIC PANEL WITH GFR
BUN: 14 mg/dL (ref 6–23)
CO2: 26 meq/L (ref 19–32)
Calcium: 9.2 mg/dL (ref 8.4–10.5)
Chloride: 105 meq/L (ref 96–112)
Creatinine, Ser: 0.8 mg/dL (ref 0.4–1.2)
GFR: 85.62 mL/min
Glucose, Bld: 99 mg/dL (ref 70–99)
Potassium: 4 meq/L (ref 3.5–5.1)
Sodium: 138 meq/L (ref 135–145)

## 2013-01-29 NOTE — Progress Notes (Signed)
Patient ID: Ruth Ruiz, female   DOB: 21-Apr-1975, 37 y.o.   MRN: [id number]  REASON for visit: Followup of hypertension  History of Present Illness:  Her blood pressure readings are dramatically better with her doing a weight loss program and cutting back on overall calorie intake as well as using nutritional supplements including and unknown herbal preparation. She is on the [employer] program On her own she has stopped her labetalol and doxazosin but is still continuing her Lasix every other day She has gone back to taking Aldactone and is also on metoprolol No lightheadedness and her blood pressure has come down progressively especially in the last week Recent blood pressure readings at home are mostly good but over the last 2 weeks the range has been 117-150s systolic and 67-81 diastolic She has not started any exercise program as yet but is planning to  Previous history: She was first found to have hypertension in November 2013 incidentally. She thinks her blood pressure was about 200/125  LAB studies: Her renal function has been normal, her aldosterone/creatinine ratio done in the afternoon was 28 but this was done without changing above medications Her urine VMA was normal. Also had a normal renal artery study with CT She has a left adrenal adenoma discovered when she was having the CT scan Also after saline infusion her aldosterone level was <1.0   Past Medical History  Diagnosis Date  . Chest pain   . New onset of headaches   . Hypertension     Past Surgical History  Procedure Laterality Date  . Tubal ligation      Family History  Problem Relation Age of Onset  . Arrhythmia Father   . Hypertension Father   . CAD Paternal Grandfather 41  . Hypertension Mother   . Cancer Mother     Uterine  . Diabetes Neg Hx     Social History:  reports that she has never smoked. She does not have any smokeless tobacco history on file. She reports that she does not drink alcohol or  use illicit drugs.  Allergies:  Allergies  Allergen Reactions  . Amlodipine     Dizziness, hot flashes and swelling      Medication List       This list is accurate as of: 01/29/13  9:17 AM.  Always use your most recent med list.               amoxicillin 500 MG capsule  Commonly known as:  AMOXIL  Take 500 mg by mouth 3 (three) times daily.     aspirin 325 MG EC tablet  Take 325 mg by mouth 2 (two) times daily.     doxazosin 4 MG tablet  Commonly known as:  CARDURA  Take 2 mg by mouth 2 (two) times daily.     furosemide 20 MG tablet  Commonly known as:  LASIX  Take 20 mg by mouth every other day.     labetalol 200 MG tablet  Commonly known as:  NORMODYNE  Take 1 tablet (200 mg total) by mouth 2 (two) times daily.     metoprolol 50 MG tablet  Commonly known as:  LOPRESSOR  50 mg at bedtime. Take 1 tablet twice a day     spironolactone 50 MG tablet  Commonly known as:  ALDACTONE  Take 25 mg by mouth daily.         Previous chemistry Results        11/24/12 10/03/12 09/11/12  06/13/12 05/30/12   Chem Profile   BUN  11 16 13 14 13    CHLORIDE  100 103 103 106 102   CO2  26 30 26 28 30    CREATININE  0.7 0.9 0.8 0.7 0.8   POTASSIUM  3.4 3.9 3.5 3.9 3.2   SODIUM  134 138 136 141 137     REVIEW OF SYSTEMS:        No history of diabetes   PHYSICAL EXAM:  BP 118/84  Pulse 68  Temp(Src) 98.2 F (36.8 C)  Resp 12  Ht 5\' 1"  (1.549 m)  Wt 262 lb 12.8 oz (119.205 kg)  BMI 49.68 kg/m2  SpO2 97%  LMP 01/27/2013  No ankle edema   ASSESSMENT:    Marked hypertension in a young patient with no secondary cause identified She has had dramatic improvement in her blood pressure control with going on her weight loss program and restarting Aldactone. Blood pressure is nearly normal today and adequately controlled with only metoprolol and Aldactone  Adrenal adenoma  which appears to be nonfunctioning and will need followup in 1 year  PLAN:   She can  continue taking Aldactone which has helped her blood pressure and prevent hypokalemia Consider increasing the dose if her potassium is low today She will continue her weight loss program She can either followup with cardiologist or myself for hypertension in the future  [facility] 01/29/2013, 9:17 AM

## 2013-01-29 NOTE — Patient Instructions (Signed)
Try stopping furosemide then take only as needed for swelling

## 2013-02-15 ENCOUNTER — Other Ambulatory Visit: Payer: Self-pay

## 2013-02-22 ENCOUNTER — Ambulatory Visit: Payer: BC Managed Care – PPO | Admitting: Cardiology

## 2013-02-26 ENCOUNTER — Ambulatory Visit: Payer: BC Managed Care – PPO | Admitting: Cardiology

## 2013-04-16 ENCOUNTER — Ambulatory Visit: Payer: BC Managed Care – PPO | Admitting: Cardiology

## 2013-06-09 ENCOUNTER — Other Ambulatory Visit: Payer: Self-pay | Admitting: Cardiology

## 2013-07-12 ENCOUNTER — Other Ambulatory Visit: Payer: Self-pay | Admitting: Cardiology

## 2013-09-14 ENCOUNTER — Encounter: Payer: Self-pay | Admitting: Cardiology

## 2015-01-09 IMAGING — CT CT CTA ABD/PEL W/CM AND/OR W/O CM
2 of 9 series · 12 of 46 positions shown, 17 images · IV contrast (Omnipaque 300)
Comparison: None.

CLINICAL DATA: Uncontrolled hypertension, evaluate for
fibromuscular dysplasia

CT ANGIOGRAPHY ABDOMEN AND PELVIS WITH CONTRAST AND WITHOUT
CONTRAST

[Series 5: venous 5.0 · axial · portal-venous · 0.86mm/px · z∈[-526,-126]mm · 10 of 97 slices shown, 15 images]
[im 9/97  soft-tissue]
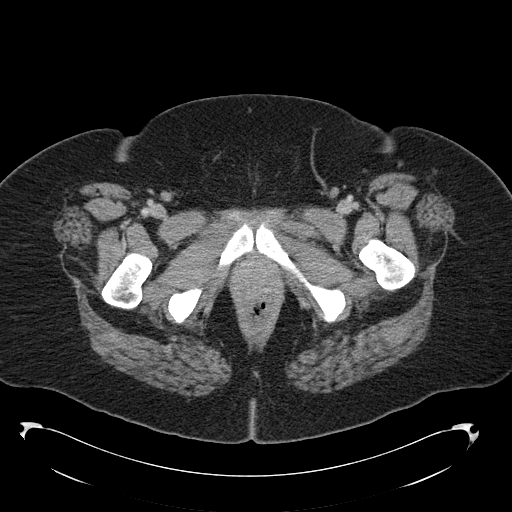
[im 9/97  bone]
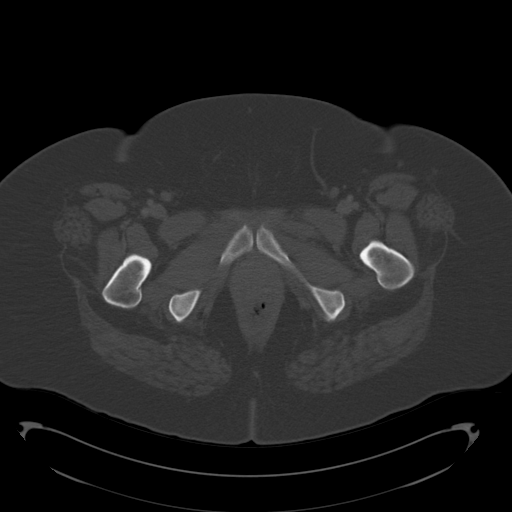
[im 17/97  soft-tissue]
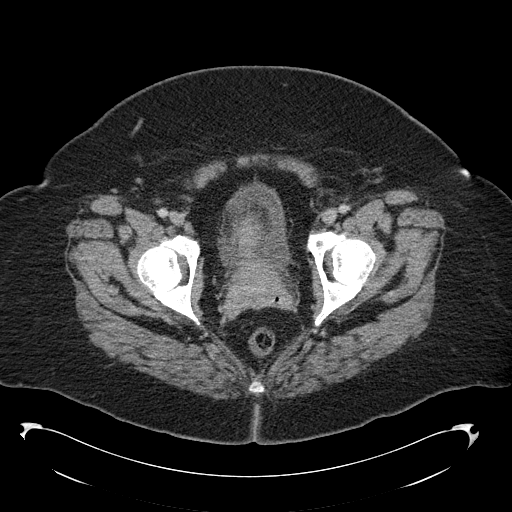
[im 33/97  soft-tissue]
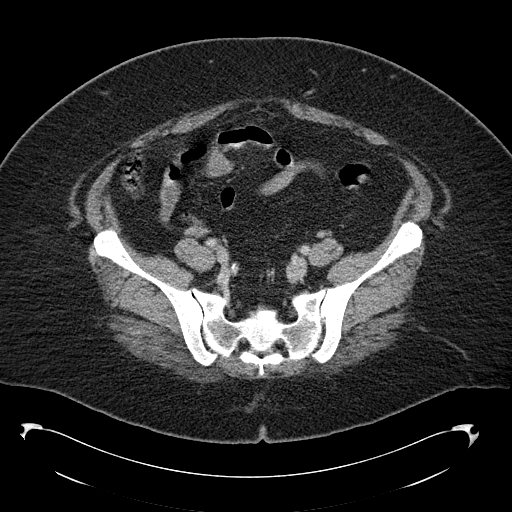
[im 41/97  soft-tissue]
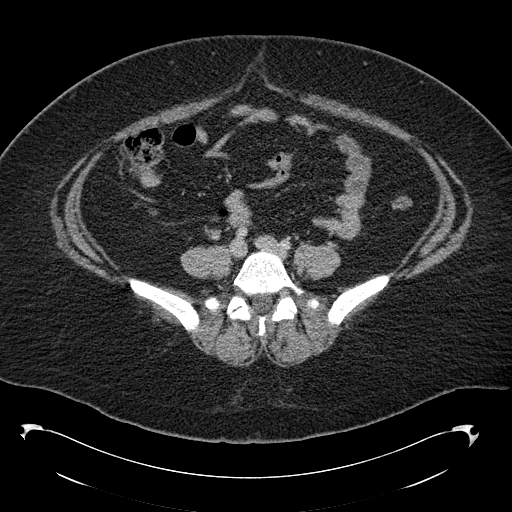
[im 49/97  soft-tissue]
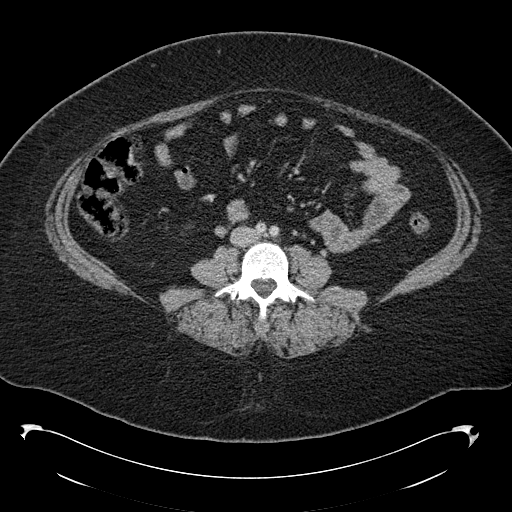
[im 57/97  soft-tissue]
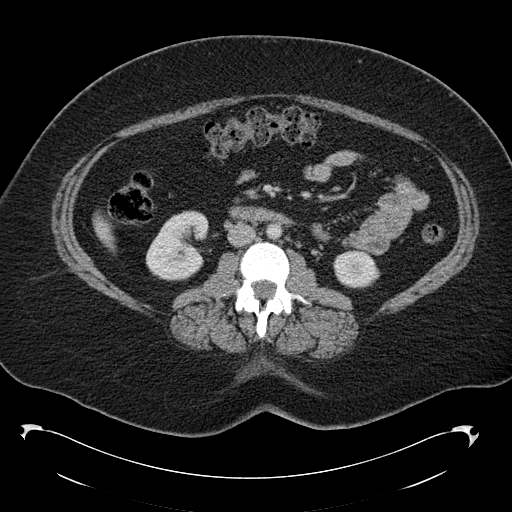
[im 65/97  soft-tissue]
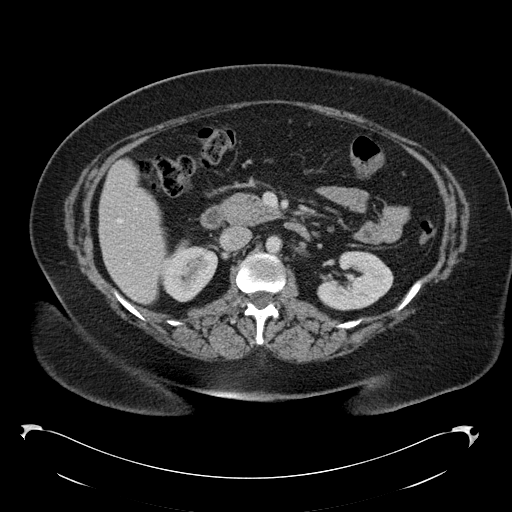
[im 65/97  lung]
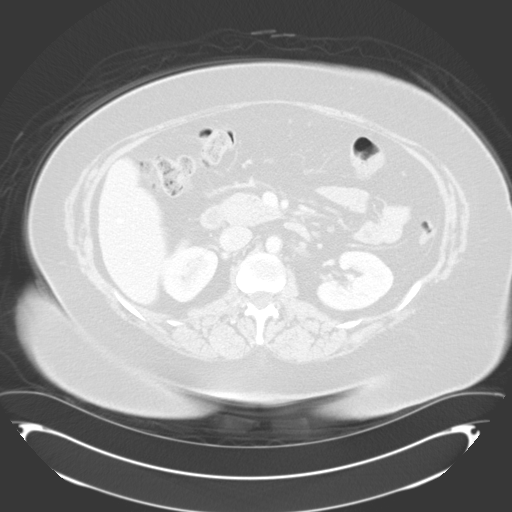
[im 73/97  lung]
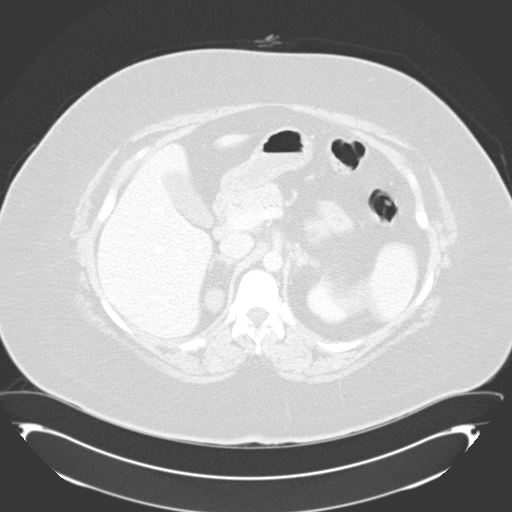
[im 81/97  soft-tissue]
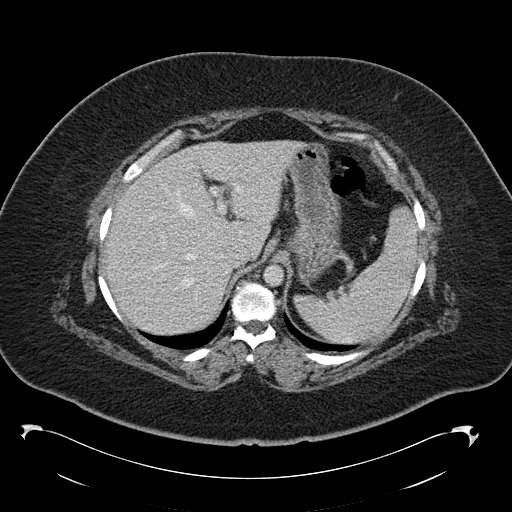
[im 81/97  lung]
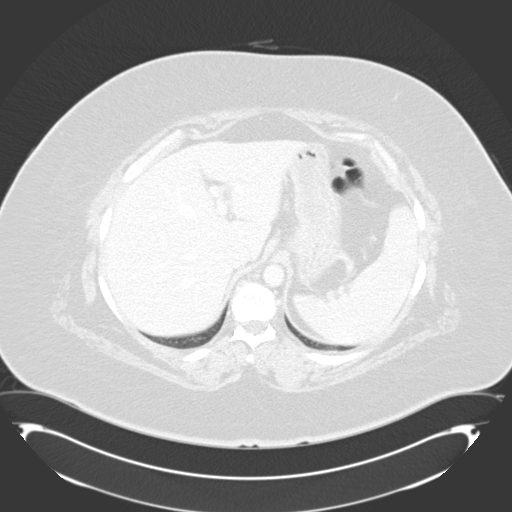
[im 89/97  soft-tissue]
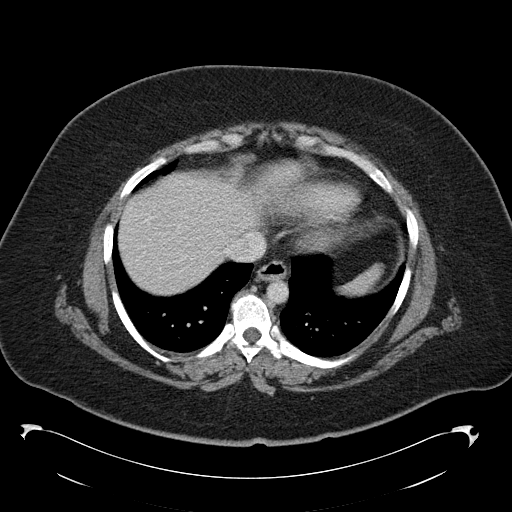
[im 89/97  lung]
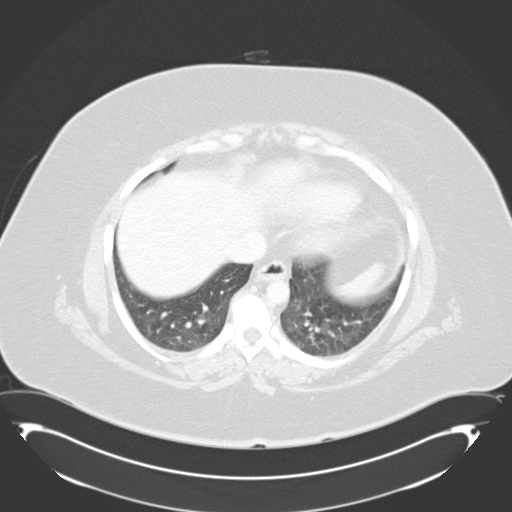
[im 89/97  bone]
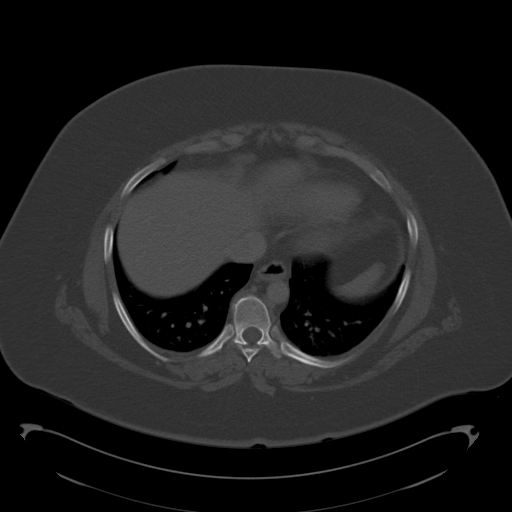

[Series 602: cor mpr art · coronal · 0.86mm/px · 2 of 121 slices shown]
[im 41/121  soft-tissue]
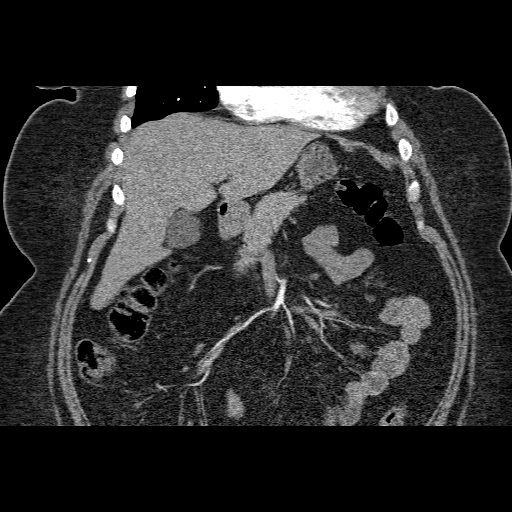
[im 81/121  soft-tissue]
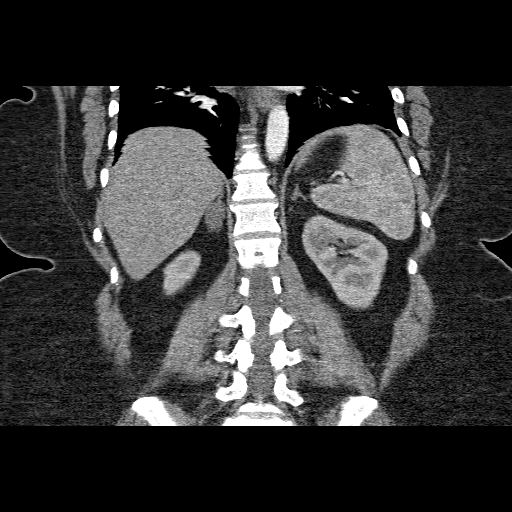

[12 of 46 positions shown; findings below may reference images not displayed]

FINDINGS: VASCULAR

Aorta: Normal caliber aorta without significant atherosclerotic
vascular plaque.

Celiac: Widely patent with normal hepatic arterial anatomy.

SMA: Widely patent without focal abnormality.

Renals: Single renal arteries bilaterally.  No evidence of
atherosclerotic disease, narrowing or focal abnormality to suggest
fibromuscular dysplasia.

IMA: Patent.

Inflow: Widely patent without significant atherosclerotic vascular
disease.

Proximal Outflow: Widely patent without significant atherosclerotic
vascular disease.

Veins: Given the limitations of non venous phase timing, no focal
venous abnormality identified.

NON-VASCULAR

Lower Chest: Mild dependent atelectasis in the lower lobes.  The
lung bases are otherwise clear.  Incompletely imaged 2.2 x 1.7 cm
enhancing or dense soft tissue mass in the right infrahilar region
abutting the right atrium.  This is separate from the esophagus.
The heart is at the upper limits of normal for size.  There is a
small hiatal hernia.

Abdomen: [DATE] x 2.9 x 1.8 cm intermediate attenuation right adrenal
nodule is nonspecific by CT scan.  The nodule demonstrates smooth
margins and is fairly homogeneous in appearance suggestive of a
benign process.

Unremarkable CT appearance of the stomach, duodenum, spleen, left
adrenal glands and pancreas.  Normal hepatic contour without focal
parenchymal lesion. Gallbladder is unremarkable. No intra or
extrahepatic biliary ductal dilatation.

Symmetric renal parenchymal enhancement bilaterally.  No focal
renal lesion or hydronephrosis.

Normal-caliber large and small bowel throughout the abdomen.  No
significant colonic diverticular disease visualized.  No free fluid
or suspicious adenopathy.  There is a small omental fat containing
umbilical hernia.

Bones: No acute fracture or aggressive appearing lytic or blastic
osseous lesion.
IMPRESSION: 1.  Normal appearance of the renal arteries, no evidence of renal
artery stenosis or fibromuscular dysplasia.

2.  Nearly 3 cm nonspecific right adrenal nodule.  Statistically,
this is highly likely an adrenal adenoma although other benign and
malignant neoplasms can not be completely excluded.  If in fact an
adenoma, consider the possibility of an aldosterone secreting
adenoma (Tiger syndrome) in the setting of poorly controlled
hypertension.

3.  On the superior-most images of this exam, there is an
incompletely imaged 2.2 x 1.7 cm soft tissue density mass in the
right infrahilar region abutting the right atrium.  Differential
considerations include a complex pericardial cyst, bronchogenic or
esophageal duplication cyst, and potentially incompletely imaged
adenopathy.

Recommend further evaluation of both the right adrenal nodule and
the right infrahilar mass with an MRI of the abdomen with and
without contrast.  The coverage of the abdominal MRI should also
include the right infrahilar lesion.  Please place a  note in the
comments section of the order to evaluate the right infrahilar mass
as well as the right adrenal lesion.

These results and recommendations were called by telephone on
09/14/2012 at [DATE] a.m. to Dr. Nasteho Qalnjo Stadelerre, who verbally
acknowledged these results.

[REDACTED]

## 2015-08-08 DIAGNOSIS — I10 Essential (primary) hypertension: Secondary | ICD-10-CM | POA: Diagnosis not present

## 2015-12-22 DIAGNOSIS — Z6841 Body Mass Index (BMI) 40.0 and over, adult: Secondary | ICD-10-CM | POA: Diagnosis not present

## 2015-12-22 DIAGNOSIS — M25512 Pain in left shoulder: Secondary | ICD-10-CM | POA: Diagnosis not present

## 2015-12-23 DIAGNOSIS — I10 Essential (primary) hypertension: Secondary | ICD-10-CM | POA: Diagnosis not present

## 2016-02-25 DIAGNOSIS — I10 Essential (primary) hypertension: Secondary | ICD-10-CM | POA: Diagnosis not present

## 2016-02-25 DIAGNOSIS — Z862 Personal history of diseases of the blood and blood-forming organs and certain disorders involving the immune mechanism: Secondary | ICD-10-CM | POA: Diagnosis not present

## 2016-02-25 DIAGNOSIS — Z6841 Body Mass Index (BMI) 40.0 and over, adult: Secondary | ICD-10-CM | POA: Diagnosis not present

## 2016-08-25 DIAGNOSIS — Z6841 Body Mass Index (BMI) 40.0 and over, adult: Secondary | ICD-10-CM | POA: Diagnosis not present

## 2016-08-25 DIAGNOSIS — I10 Essential (primary) hypertension: Secondary | ICD-10-CM | POA: Diagnosis not present

## 2016-08-25 DIAGNOSIS — Z862 Personal history of diseases of the blood and blood-forming organs and certain disorders involving the immune mechanism: Secondary | ICD-10-CM | POA: Diagnosis not present

## 2016-10-26 DIAGNOSIS — Z6841 Body Mass Index (BMI) 40.0 and over, adult: Secondary | ICD-10-CM | POA: Diagnosis not present

## 2016-10-26 DIAGNOSIS — J019 Acute sinusitis, unspecified: Secondary | ICD-10-CM | POA: Diagnosis not present

## 2017-02-24 DIAGNOSIS — I10 Essential (primary) hypertension: Secondary | ICD-10-CM | POA: Diagnosis not present

## 2017-02-24 DIAGNOSIS — Z86018 Personal history of other benign neoplasm: Secondary | ICD-10-CM | POA: Diagnosis not present

## 2017-02-24 DIAGNOSIS — Z6841 Body Mass Index (BMI) 40.0 and over, adult: Secondary | ICD-10-CM | POA: Diagnosis not present

## 2017-04-18 DIAGNOSIS — Z6841 Body Mass Index (BMI) 40.0 and over, adult: Secondary | ICD-10-CM | POA: Diagnosis not present

## 2017-04-18 DIAGNOSIS — R300 Dysuria: Secondary | ICD-10-CM | POA: Diagnosis not present

## 2017-04-18 DIAGNOSIS — N3000 Acute cystitis without hematuria: Secondary | ICD-10-CM | POA: Diagnosis not present

## 2017-06-03 DIAGNOSIS — K449 Diaphragmatic hernia without obstruction or gangrene: Secondary | ICD-10-CM | POA: Diagnosis not present

## 2017-06-06 DIAGNOSIS — I10 Essential (primary) hypertension: Secondary | ICD-10-CM | POA: Diagnosis not present

## 2017-06-06 DIAGNOSIS — Z713 Dietary counseling and surveillance: Secondary | ICD-10-CM | POA: Diagnosis not present

## 2017-06-06 DIAGNOSIS — G4733 Obstructive sleep apnea (adult) (pediatric): Secondary | ICD-10-CM | POA: Diagnosis not present

## 2017-06-06 DIAGNOSIS — Z6841 Body Mass Index (BMI) 40.0 and over, adult: Secondary | ICD-10-CM | POA: Diagnosis not present

## 2017-07-04 DIAGNOSIS — Z6841 Body Mass Index (BMI) 40.0 and over, adult: Secondary | ICD-10-CM | POA: Diagnosis not present

## 2017-07-04 DIAGNOSIS — Z713 Dietary counseling and surveillance: Secondary | ICD-10-CM | POA: Diagnosis not present

## 2017-08-22 DIAGNOSIS — I10 Essential (primary) hypertension: Secondary | ICD-10-CM | POA: Diagnosis not present

## 2017-08-22 DIAGNOSIS — Z6841 Body Mass Index (BMI) 40.0 and over, adult: Secondary | ICD-10-CM | POA: Diagnosis not present

## 2017-08-22 DIAGNOSIS — Z86018 Personal history of other benign neoplasm: Secondary | ICD-10-CM | POA: Diagnosis not present

## 2017-10-06 DIAGNOSIS — Z6841 Body Mass Index (BMI) 40.0 and over, adult: Secondary | ICD-10-CM | POA: Diagnosis not present

## 2017-10-06 DIAGNOSIS — Z713 Dietary counseling and surveillance: Secondary | ICD-10-CM | POA: Diagnosis not present

## 2017-11-21 DIAGNOSIS — Z6841 Body Mass Index (BMI) 40.0 and over, adult: Secondary | ICD-10-CM | POA: Diagnosis not present

## 2017-11-21 DIAGNOSIS — Z713 Dietary counseling and surveillance: Secondary | ICD-10-CM | POA: Diagnosis not present

## 2017-11-23 DIAGNOSIS — R002 Palpitations: Secondary | ICD-10-CM | POA: Diagnosis not present

## 2017-11-23 DIAGNOSIS — R55 Syncope and collapse: Secondary | ICD-10-CM | POA: Diagnosis not present

## 2018-04-19 DIAGNOSIS — Z6841 Body Mass Index (BMI) 40.0 and over, adult: Secondary | ICD-10-CM | POA: Diagnosis not present

## 2018-04-19 DIAGNOSIS — J209 Acute bronchitis, unspecified: Secondary | ICD-10-CM | POA: Diagnosis not present

## 2018-07-19 DIAGNOSIS — N9089 Other specified noninflammatory disorders of vulva and perineum: Secondary | ICD-10-CM | POA: Diagnosis not present

## 2018-07-19 DIAGNOSIS — L919 Hypertrophic disorder of the skin, unspecified: Secondary | ICD-10-CM | POA: Diagnosis not present

## 2018-07-19 DIAGNOSIS — Z6841 Body Mass Index (BMI) 40.0 and over, adult: Secondary | ICD-10-CM | POA: Diagnosis not present

## 2019-02-26 DIAGNOSIS — I10 Essential (primary) hypertension: Secondary | ICD-10-CM | POA: Diagnosis not present

## 2019-02-26 DIAGNOSIS — G4733 Obstructive sleep apnea (adult) (pediatric): Secondary | ICD-10-CM | POA: Diagnosis not present

## 2019-02-26 DIAGNOSIS — Z6841 Body Mass Index (BMI) 40.0 and over, adult: Secondary | ICD-10-CM | POA: Diagnosis not present

## 2021-05-30 DIAGNOSIS — I10 Essential (primary) hypertension: Secondary | ICD-10-CM | POA: Diagnosis not present

## 2021-05-30 DIAGNOSIS — Z6841 Body Mass Index (BMI) 40.0 and over, adult: Secondary | ICD-10-CM | POA: Diagnosis not present

## 2021-05-30 DIAGNOSIS — M545 Low back pain, unspecified: Secondary | ICD-10-CM | POA: Diagnosis not present

## 2021-05-30 DIAGNOSIS — G4733 Obstructive sleep apnea (adult) (pediatric): Secondary | ICD-10-CM | POA: Diagnosis not present

## 2021-05-30 DIAGNOSIS — R609 Edema, unspecified: Secondary | ICD-10-CM | POA: Diagnosis not present

## 2021-09-10 DIAGNOSIS — M545 Low back pain, unspecified: Secondary | ICD-10-CM | POA: Diagnosis not present

## 2021-09-10 DIAGNOSIS — R609 Edema, unspecified: Secondary | ICD-10-CM | POA: Diagnosis not present

## 2021-09-10 DIAGNOSIS — I10 Essential (primary) hypertension: Secondary | ICD-10-CM | POA: Diagnosis not present

## 2021-09-10 DIAGNOSIS — G4733 Obstructive sleep apnea (adult) (pediatric): Secondary | ICD-10-CM | POA: Diagnosis not present

## 2021-09-10 DIAGNOSIS — Z6841 Body Mass Index (BMI) 40.0 and over, adult: Secondary | ICD-10-CM | POA: Diagnosis not present

## 2021-11-17 ENCOUNTER — Encounter (HOSPITAL_BASED_OUTPATIENT_CLINIC_OR_DEPARTMENT_OTHER): Payer: Self-pay

## 2021-11-17 DIAGNOSIS — R0681 Apnea, not elsewhere classified: Secondary | ICD-10-CM

## 2021-12-07 ENCOUNTER — Ambulatory Visit: Payer: Self-pay | Attending: Physician Assistant | Admitting: Neurology

## 2021-12-07 DIAGNOSIS — G4733 Obstructive sleep apnea (adult) (pediatric): Secondary | ICD-10-CM | POA: Insufficient documentation

## 2021-12-07 DIAGNOSIS — R0681 Apnea, not elsewhere classified: Secondary | ICD-10-CM

## 2021-12-12 NOTE — Procedures (Unsigned)
  [place] A. Merlene Laughter, MD     [email or web address]               HOME SLEEP STUDY  LOCATION: [place]   Patient Name: Ruth Ruiz, Ruth Ruiz Date: 12/07/2021 Gender: Female D.O.B: [birth year] Age (years): 46 Referring Provider: Clemmie Krill PA-C Height (inches): 62 Interpreting Physician: Phillips Odor MD, ABSM Weight (lbs): 303 RPSGT: Rosebud Poles BMI: 33 MRN: [id number] Neck Size: CLINICAL INFORMATION Sleep Study Type: HST     Indication for sleep study: N/A     Epworth Sleepiness Score: N/A  SLEEP STUDY TECHNIQUE A multi-channel overnight portable sleep study was performed. The channels recorded were: nasal airflow, thoracic respiratory movement, and oxygen saturation with a pulse oximetry. Snoring was also monitored.  MEDICATIONS Patient self administered medications include: N/A.  Current Outpatient Medications:    amoxicillin (AMOXIL) 500 MG capsule, Take 500 mg by mouth 3 (three) times daily., Disp: , Rfl:    aspirin 325 MG EC tablet, Take 325 mg by mouth 2 (two) times daily. , Disp: , Rfl:    furosemide (LASIX) 20 MG tablet, Take 20 mg by mouth every other day., Disp: , Rfl:    metoprolol (LOPRESSOR) 50 MG tablet, 50 mg at bedtime. Take 1 tablet twice a day, Disp: , Rfl:    spironolactone (ALDACTONE) 25 MG tablet, TAKE ONE TABLET BY MOUTH ONCE DAILY ***PATIENT NEEDS APPOINTMENT BEFORE ANYMORE REFILLS WILL BE GRANTED***, Disp: 30 tablet, Rfl: 0   spironolactone (ALDACTONE) 50 MG tablet, Take 25 mg by mouth daily., Disp: , Rfl:    SLEEP ARCHITECTURE Patient was studied for 476.4 minutes. The sleep efficiency was 99.3 % and the patient was supine for 0%. The arousal index was 0.0 per hour.  RESPIRATORY PARAMETERS The overall AHI was 58.4 per hour, with a central apnea index of 0 per hour.  The oxygen nadir was 59% during sleep.     CARDIAC DATA Mean heart rate during sleep was 61.8 bpm.  IMPRESSIONS Severe obstructive sleep  apnea occurred during this study (AHI = 58.4/h). Autopap  8-20 is recommended.   Delano Metz, MD Diplomate, American Board of Sleep Medicine.

## 2021-12-12 NOTE — Progress Notes (Deleted)
   Phillips Odor, MD

## 2021-12-12 NOTE — Progress Notes (Deleted)
  [place] A. Merlene Laughter, MD     [email or web address]             NOCTURNAL POLYSOMNOGRAPHY   LOCATION: [facility]     Delano Metz, MD Diplomate, American Board of Sleep Medicine.

## 2022-03-19 ENCOUNTER — Telehealth: Payer: 59 | Admitting: Family Medicine

## 2022-03-19 DIAGNOSIS — R052 Subacute cough: Secondary | ICD-10-CM

## 2022-03-19 MED ORDER — PREDNISONE 20 MG PO TABS: 40.0000 mg | ORAL_TABLET | Freq: Every day | ORAL | 0 refills | Status: AC

## 2022-03-19 MED ORDER — PROMETHAZINE-DM 6.25-15 MG/5ML PO SYRP: 5.0000 mL | ORAL_SOLUTION | Freq: Three times a day (TID) | ORAL | 0 refills | Status: AC | PRN

## 2022-03-19 MED ORDER — ALBUTEROL SULFATE HFA 108 (90 BASE) MCG/ACT IN AERS: 1.0000 | INHALATION_SPRAY | Freq: Four times a day (QID) | RESPIRATORY_TRACT | 0 refills | Status: AC | PRN

## 2022-03-19 NOTE — Progress Notes (Signed)
Virtual Visit Consent   Ruth Ruiz, you are scheduled for a virtual visit with a [facility] provider today. Just as with appointments in the office, your consent must be obtained to participate. Your consent will be active for this visit and any virtual visit you may have with one of our providers in the next 365 days. If you have a MyChart account, a copy of this consent can be sent to you electronically.  As this is a virtual visit, video technology does not allow for your provider to perform a traditional examination. This may limit your provider's ability to fully assess your condition. If your provider identifies any concerns that need to be evaluated in person or the need to arrange testing (such as labs, EKG, etc.), we will make arrangements to do so. Although advances in technology are sophisticated, we cannot ensure that it will always work on either your end or our end. If the connection with a video visit is poor, the visit may have to be switched to a telephone visit. With either a video or telephone visit, we are not always able to ensure that we have a secure connection.  By engaging in this virtual visit, you consent to the provision of healthcare and authorize for your insurance to be billed (if applicable) for the services provided during this visit. Depending on your insurance coverage, you may receive a charge related to this service.  I need to obtain your verbal consent now. Are you willing to proceed with your visit today? Ruth Ruiz has provided verbal consent on 03/19/2022 for a virtual visit (video or telephone). Perlie Mayo, NP  Date: 03/19/2022 11:46 AM  Virtual Visit via Video Note   I, Perlie Mayo, connected with  Ruth Ruiz  ([id number], [birth year]) on 03/19/22 at 11:45 AM EST by a video-enabled telemedicine application and verified that I am speaking with the correct person using two identifiers.  Location: Patient: Virtual Visit Location Patient:  Home Provider: Virtual Visit Location Provider: Home Office   I discussed the limitations of evaluation and management by telemedicine and the availability of in person appointments. The patient expressed understanding and agreed to proceed.    History of Present Illness: Ruth Ruiz is a 46 y.o. who identifies as a female who was assigned female at birth, and is being seen today for cough on going for almost 10- 14 days. Reports having Flu like illness about 7-10 days prior to this. And cough has lingered since. Has been taking OTC cough syrup and meds with limited relief. Denies fevers, chills, sinus congestion, chest pain. Cough abdominal discomfort, mild shortness of breath at times.    Observations/Objective: Patient is well-developed, well-nourished in no acute distress.  Resting comfortably  at home.  Head is normocephalic, atraumatic.  No labored breathing.  Speech is clear and coherent with logical content.  Patient is alert and oriented at baseline.    Assessment and Plan: 1. Subacute cough - albuterol (VENTOLIN HFA) 108 (90 Base) MCG/ACT inhaler; Inhale 1-2 puffs into the lungs every 6 (six) hours as needed for wheezing or shortness of breath.  Dispense: 8 g; Refill: 0 - predniSONE (DELTASONE) 20 MG tablet; Take 2 tablets (40 mg total) by mouth daily with breakfast for 5 days.  Dispense: 10 tablet; Refill: 0 - promethazine-dextromethorphan (PROMETHAZINE-DM) 6.25-15 MG/5ML syrup; Take 5 mLs by mouth 3 (three) times daily as needed for cough.  Dispense: 118 mL; Refill: 0  -rest -hydrate -keep throat moist -take medications as  directed   Reviewed side effects, risks and benefits of medication.    Patient acknowledged agreement and understanding of the plan.   Past Medical, Surgical, Social History, Allergies, and Medications have been Reviewed.   Follow Up Instructions: I discussed the assessment and treatment plan with the patient. The patient was provided an  opportunity to ask questions and all were answered. The patient agreed with the plan and demonstrated an understanding of the instructions.  A copy of instructions were sent to the patient via MyChart unless otherwise noted below.     The patient was advised to call back or seek an in-person evaluation if the symptoms worsen or if the condition fails to improve as anticipated.  Time:  I spent 10 minutes with the patient via telehealth technology discussing the above problems/concerns.    Perlie Mayo, NP

## 2022-03-19 NOTE — Patient Instructions (Signed)
Ruth Ruiz, thank you for joining Ruth Mayo, NP for today's virtual visit.  While this provider is not your primary care provider (PCP), if your PCP is located in our provider database this encounter information will be shared with them immediately following your visit.   [facility] account gives you access to today's visit and all your visits, tests, and labs performed at [facility] " click here if you don't have a [facility] account or go to [email or web address]  Consent: (Patient) Ruth Ruiz provided verbal consent for this virtual visit at the beginning of the encounter.  Current Medications:  Medications ordered in this encounter:  Meds ordered this encounter  Medications   albuterol (VENTOLIN HFA) 108 (90 Base) MCG/ACT inhaler    Sig: Inhale 1-2 puffs into the lungs every 6 (six) hours as needed for wheezing or shortness of breath.    Dispense:  8 g    Refill:  0    Order Specific Question:   Supervising Provider    Answer:   Chase Picket [[id number]]   predniSONE (DELTASONE) 20 MG tablet    Sig: Take 2 tablets (40 mg total) by mouth daily with breakfast for 5 days.    Dispense:  10 tablet    Refill:  0    Order Specific Question:   Supervising Provider    Answer:   Chase Picket [id number]   promethazine-dextromethorphan (PROMETHAZINE-DM) 6.25-15 MG/5ML syrup    Sig: Take 5 mLs by mouth 3 (three) times daily as needed for cough.    Dispense:  118 mL    Refill:  0    Order Specific Question:   Supervising Provider    Answer:   Chase Picket [[id number]]     *If you need refills on other medications prior to your next appointment, please contact your pharmacy*  Follow-Up: Call back or seek an in-person evaluation if the symptoms worsen or if the condition fails to improve as anticipated.  [place] [phone]  Other Instructions   -rest -hydrate -keep throat moist -take medications as  directed  Cough, Adult A cough helps to clear your throat and lungs. A cough may be a sign of an illness or another medical condition. An acute cough may only last 2-3 weeks, while a chronic cough may last 8 or more weeks. Many things can cause a cough. They include: Germs (viruses or bacteria) that attack the airway. Breathing in things that bother (irritate) your lungs. Allergies. Asthma. Mucus that runs down the back of your throat (postnasal drip). Smoking. Acid backing up from the stomach into the tube that moves food from the mouth to the stomach (gastroesophageal reflux). Some medicines. Lung problems. Other medical conditions, such as heart failure or a blood clot in the lung (pulmonary embolism). Follow these instructions at home: Medicines Take over-the-counter and prescription medicines only as told by your doctor. Talk with your doctor before you take medicines that stop a cough (cough suppressants). Lifestyle  Do not smoke, and try not to be around smoke. Do not use any products that contain nicotine or tobacco, such as cigarettes, e-cigarettes, and chewing tobacco. If you need help quitting, ask your doctor. Drink enough fluid to keep your pee (urine) pale yellow. Avoid caffeine. Do not drink alcohol if your doctor tells you not to drink. General instructions  Watch for any changes in your cough. Tell your doctor about them. Always cover your mouth when you cough. Stay  away from things that make you cough, such as perfume, candles, campfire smoke, or cleaning products. If the air is dry, use a cool mist vaporizer or humidifier in your home. If your cough is worse at night, try using extra pillows to raise your head up higher while you sleep. Rest as needed. Keep all follow-up visits as told by your doctor. This is important. Contact a doctor if: You have new symptoms. You cough up pus. Your cough does not get better after 2-3 weeks, or your cough gets worse. Cough  medicine does not help your cough and you are not sleeping well. You have pain that gets worse or pain that is not helped with medicine. You have a fever. You are losing weight and you do not know why. You have night sweats. Get help right away if: You cough up blood. You have trouble breathing. Your heartbeat is very fast. These symptoms may be an emergency. Do not wait to see if the symptoms will go away. Get medical help right away. Call your local emergency services (911 in the [place] Do not drive yourself to the hospital. Summary A cough helps to clear your throat and lungs. Many things can cause a cough. Take over-the-counter and prescription medicines only as told by your doctor. Always cover your mouth when you cough. Contact a doctor if you have new symptoms or you have a cough that does not get better or gets worse. This information is not intended to replace advice given to you by your health care provider. Make sure you discuss any questions you have with your health care provider. Document Revised: 05/18/2019 Document Reviewed: 04/17/2018 Elsevier Patient Education  [employer] [place].   If you have been instructed to have an in-person evaluation today at a local Urgent Care facility, please use the link below. It will take you to a list of all of our available [facility], including address, phone number and hours of operation. Please do not delay care.  [facility]  If you or a family member do not have a primary care provider, use the link below to schedule a visit and establish care. When you choose a [facility] primary care physician or advanced practice provider, you gain a long-term partner in health. Find a Primary Care Provider  Learn more about [facility]'s in-office and virtual care options: [facility]

## 2022-03-19 NOTE — Progress Notes (Deleted)
Virtual Visit Consent   Ruth Ruiz, you are scheduled for a virtual visit with a [facility] provider today. Just as with appointments in the office, your consent must be obtained to participate. Your consent will be active for this visit and any virtual visit you may have with one of our providers in the next 365 days. If you have a MyChart account, a copy of this consent can be sent to you electronically.  As this is a virtual visit, video technology does not allow for your provider to perform a traditional examination. This may limit your provider's ability to fully assess your condition. If your provider identifies any concerns that need to be evaluated in person or the need to arrange testing (such as labs, EKG, etc.), we will make arrangements to do so. Although advances in technology are sophisticated, we cannot ensure that it will always work on either your end or our end. If the connection with a video visit is poor, the visit may have to be switched to a telephone visit. With either a video or telephone visit, we are not always able to ensure that we have a secure connection.  By engaging in this virtual visit, you consent to the provision of healthcare and authorize for your insurance to be billed (if applicable) for the services provided during this visit. Depending on your insurance coverage, you may receive a charge related to this service.  I need to obtain your verbal consent now. Are you willing to proceed with your visit today? Adiba Fargnoli has provided verbal consent on 03/19/2022 for a virtual visit (video or telephone). Ruth Mayo, NP  Date: 03/19/2022 11:46 AM  Virtual Visit via Video Note   I, Ruth Ruiz, connected with  Ruth Ruiz  ([id number], 20-Mar-1976) on 03/19/22 at 11:45 AM EST by a video-enabled telemedicine application and verified that I am speaking with the correct person using two identifiers.  Location: Patient: Virtual Visit Location Patient:  Home Provider: Virtual Visit Location Provider: Home Office   I discussed the limitations of evaluation and management by telemedicine and the availability of in person appointments. The patient expressed understanding and agreed to proceed.    History of Present Illness: Ruth Ruiz is a 46 y.o. who identifies as a female who was assigned female at birth, and is being seen today for cough on going for almost 10- 14 days. Reports having Flu like illness about 7-10 days prior to this. And cough has lingered since. Has been taking OTC cough syrup and meds with limited relief. Denies fevers, chills, sinus congestion, chest pain. Cough abdominal discomfort, mild shortness of breath at times.    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  Resting comfortably  at home.  Head is normocephalic, atraumatic.  No labored breathing.  Speech is clear and coherent with logical content.  Patient is alert and oriented at baseline.    Assessment and Plan: 1. Subacute cough  - albuterol (VENTOLIN HFA) 108 (90 Base) MCG/ACT inhaler; Inhale 1-2 puffs into the lungs every 6 (six) hours as needed for wheezing or shortness of breath.  Dispense: 8 g; Refill: 0 - predniSONE (DELTASONE)  20 MG tablet; Take 2 tablets (40 mg total) by mouth daily with breakfast for 5 days.  Dispense: 10 tablet; Refill: 0 - promethazine-dextromethorphan (PROMETHAZINE-DM) 6.25-15 MG/5ML syrup; Take 5 mLs by mouth 3 (three) times daily as needed for cough.  Dispense: 118 mL; Refill: 0  -rest -hydrate -keep throat moist -take medications as directed   Reviewed side effects, risks  and benefits of medication.    Patient acknowledged agreement and understanding of the plan.   Past Medical, Surgical, Social History, Allergies, and Medications have been Reviewed.   Follow Up Instructions: I discussed the assessment and treatment plan with the patient. The patient was provided an opportunity to ask questions and all were answered. The patient agreed with the plan and demonstrated an understanding of the instructions.  A copy of instructions were sent to the patient via MyChart unless otherwise noted below.     The patient was advised to call back or seek an in-person evaluation if the symptoms worsen or if the condition fails to improve as anticipated.  Time:  I spent 10 minutes with the patient via telehealth technology discussing the above problems/concerns.    Ruth Mayo, NP

## 2023-01-18 DIAGNOSIS — R609 Edema, unspecified: Secondary | ICD-10-CM | POA: Diagnosis not present

## 2023-01-18 DIAGNOSIS — K429 Umbilical hernia without obstruction or gangrene: Secondary | ICD-10-CM | POA: Diagnosis not present

## 2023-01-18 DIAGNOSIS — Z6841 Body Mass Index (BMI) 40.0 and over, adult: Secondary | ICD-10-CM | POA: Diagnosis not present

## 2023-01-18 DIAGNOSIS — S39012A Strain of muscle, fascia and tendon of lower back, initial encounter: Secondary | ICD-10-CM | POA: Diagnosis not present

## 2023-01-18 DIAGNOSIS — I10 Essential (primary) hypertension: Secondary | ICD-10-CM | POA: Diagnosis not present

## 2023-01-18 DIAGNOSIS — G4733 Obstructive sleep apnea (adult) (pediatric): Secondary | ICD-10-CM | POA: Diagnosis not present

## 2023-02-28 DIAGNOSIS — Z6841 Body Mass Index (BMI) 40.0 and over, adult: Secondary | ICD-10-CM | POA: Diagnosis not present

## 2023-02-28 DIAGNOSIS — M545 Low back pain, unspecified: Secondary | ICD-10-CM | POA: Diagnosis not present

## 2023-02-28 DIAGNOSIS — R609 Edema, unspecified: Secondary | ICD-10-CM | POA: Diagnosis not present

## 2023-02-28 DIAGNOSIS — K429 Umbilical hernia without obstruction or gangrene: Secondary | ICD-10-CM | POA: Diagnosis not present

## 2023-02-28 DIAGNOSIS — B079 Viral wart, unspecified: Secondary | ICD-10-CM | POA: Diagnosis not present

## 2023-02-28 DIAGNOSIS — J40 Bronchitis, not specified as acute or chronic: Secondary | ICD-10-CM | POA: Diagnosis not present

## 2023-02-28 DIAGNOSIS — G4733 Obstructive sleep apnea (adult) (pediatric): Secondary | ICD-10-CM | POA: Diagnosis not present

## 2023-02-28 DIAGNOSIS — I10 Essential (primary) hypertension: Secondary | ICD-10-CM | POA: Diagnosis not present

## 2023-07-25 DIAGNOSIS — M545 Low back pain, unspecified: Secondary | ICD-10-CM | POA: Diagnosis not present

## 2023-07-25 DIAGNOSIS — Z6841 Body Mass Index (BMI) 40.0 and over, adult: Secondary | ICD-10-CM | POA: Diagnosis not present

## 2023-07-25 DIAGNOSIS — R609 Edema, unspecified: Secondary | ICD-10-CM | POA: Diagnosis not present

## 2023-07-25 DIAGNOSIS — I10 Essential (primary) hypertension: Secondary | ICD-10-CM | POA: Diagnosis not present

## 2023-07-25 DIAGNOSIS — G4733 Obstructive sleep apnea (adult) (pediatric): Secondary | ICD-10-CM | POA: Diagnosis not present

## 2023-12-21 DIAGNOSIS — Z1231 Encounter for screening mammogram for malignant neoplasm of breast: Secondary | ICD-10-CM | POA: Diagnosis not present
